# Patient Record
Sex: Male | Born: 1965 | Race: Black or African American | Hispanic: No | Marital: Single | Smoking: Never smoker
Health system: Southern US, Community
[De-identification: ages and names within clinical notes are randomized; demographics above are authoritative.]

## PROBLEM LIST (undated history)

## (undated) DIAGNOSIS — E119 Type 2 diabetes mellitus without complications: Secondary | ICD-10-CM

## (undated) DIAGNOSIS — D849 Immunodeficiency, unspecified: Secondary | ICD-10-CM

## (undated) DIAGNOSIS — G629 Polyneuropathy, unspecified: Secondary | ICD-10-CM

## (undated) DIAGNOSIS — I639 Cerebral infarction, unspecified: Secondary | ICD-10-CM

## (undated) DIAGNOSIS — R413 Other amnesia: Secondary | ICD-10-CM

## (undated) HISTORY — PX: FOOT SURGERY: SHX648

## (undated) HISTORY — PX: SALIVARY GLAND SURGERY: SHX768

## (undated) HISTORY — DX: Cerebral infarction, unspecified: I63.9

---

## 1998-01-21 ENCOUNTER — Emergency Department (HOSPITAL_COMMUNITY): Admission: EM | Admit: 1998-01-21 | Discharge: 1998-01-21 | Payer: Self-pay | Admitting: Emergency Medicine

## 1998-05-23 ENCOUNTER — Emergency Department (HOSPITAL_COMMUNITY): Admission: EM | Admit: 1998-05-23 | Discharge: 1998-05-23 | Payer: Self-pay | Admitting: Emergency Medicine

## 1998-06-14 ENCOUNTER — Emergency Department (HOSPITAL_COMMUNITY): Admission: EM | Admit: 1998-06-14 | Discharge: 1998-06-14 | Payer: Self-pay | Admitting: Emergency Medicine

## 1998-12-20 ENCOUNTER — Emergency Department (HOSPITAL_COMMUNITY): Admission: EM | Admit: 1998-12-20 | Discharge: 1998-12-20 | Payer: Self-pay | Admitting: Emergency Medicine

## 1999-05-16 ENCOUNTER — Encounter: Payer: Self-pay | Admitting: Family Medicine

## 1999-05-16 ENCOUNTER — Emergency Department (HOSPITAL_COMMUNITY): Admission: EM | Admit: 1999-05-16 | Discharge: 1999-05-16 | Payer: Self-pay | Admitting: Emergency Medicine

## 1999-07-20 ENCOUNTER — Ambulatory Visit (HOSPITAL_COMMUNITY): Admission: RE | Admit: 1999-07-20 | Discharge: 1999-07-20 | Payer: Self-pay | Admitting: Family Medicine

## 1999-07-20 ENCOUNTER — Encounter: Payer: Self-pay | Admitting: Family Medicine

## 1999-09-07 ENCOUNTER — Encounter (HOSPITAL_COMMUNITY): Admission: RE | Admit: 1999-09-07 | Discharge: 1999-12-06 | Payer: Self-pay | Admitting: Family Medicine

## 1999-09-14 ENCOUNTER — Encounter: Payer: Self-pay | Admitting: Family Medicine

## 1999-09-14 ENCOUNTER — Ambulatory Visit (HOSPITAL_COMMUNITY): Admission: RE | Admit: 1999-09-14 | Discharge: 1999-09-14 | Payer: Self-pay | Admitting: Family Medicine

## 2000-01-03 ENCOUNTER — Encounter (HOSPITAL_COMMUNITY): Admission: RE | Admit: 2000-01-03 | Discharge: 2000-04-02 | Payer: Self-pay | Admitting: Family Medicine

## 2000-04-03 ENCOUNTER — Encounter (HOSPITAL_COMMUNITY): Admission: RE | Admit: 2000-04-03 | Discharge: 2000-07-02 | Payer: Self-pay | Admitting: Family Medicine

## 2000-07-03 ENCOUNTER — Encounter (HOSPITAL_COMMUNITY): Admission: RE | Admit: 2000-07-03 | Discharge: 2000-10-01 | Payer: Self-pay | Admitting: Family Medicine

## 2002-01-01 ENCOUNTER — Encounter: Payer: Self-pay | Admitting: Emergency Medicine

## 2002-01-01 ENCOUNTER — Emergency Department (HOSPITAL_COMMUNITY): Admission: EM | Admit: 2002-01-01 | Discharge: 2002-01-01 | Payer: Self-pay | Admitting: Emergency Medicine

## 2002-01-08 ENCOUNTER — Emergency Department (HOSPITAL_COMMUNITY): Admission: EM | Admit: 2002-01-08 | Discharge: 2002-01-08 | Payer: Self-pay | Admitting: Emergency Medicine

## 2002-04-27 ENCOUNTER — Encounter: Admission: RE | Admit: 2002-04-27 | Discharge: 2002-04-27 | Payer: Self-pay | Admitting: Nephrology

## 2002-04-27 ENCOUNTER — Encounter: Payer: Self-pay | Admitting: Nephrology

## 2003-10-04 ENCOUNTER — Emergency Department (HOSPITAL_COMMUNITY): Admission: EM | Admit: 2003-10-04 | Discharge: 2003-10-04 | Payer: Self-pay | Admitting: Emergency Medicine

## 2004-04-13 ENCOUNTER — Ambulatory Visit: Payer: Self-pay | Admitting: Internal Medicine

## 2004-11-01 ENCOUNTER — Ambulatory Visit: Payer: Self-pay | Admitting: Internal Medicine

## 2004-11-20 ENCOUNTER — Ambulatory Visit: Payer: Self-pay | Admitting: Internal Medicine

## 2005-01-04 ENCOUNTER — Ambulatory Visit: Payer: Self-pay | Admitting: Internal Medicine

## 2005-10-23 ENCOUNTER — Ambulatory Visit: Payer: Self-pay | Admitting: Internal Medicine

## 2006-01-12 ENCOUNTER — Emergency Department (HOSPITAL_COMMUNITY): Admission: EM | Admit: 2006-01-12 | Discharge: 2006-01-12 | Payer: Self-pay | Admitting: Family Medicine

## 2006-05-23 ENCOUNTER — Ambulatory Visit: Payer: Self-pay | Admitting: Internal Medicine

## 2006-05-31 ENCOUNTER — Ambulatory Visit: Payer: Self-pay | Admitting: Internal Medicine

## 2006-06-17 ENCOUNTER — Ambulatory Visit: Payer: Self-pay | Admitting: Internal Medicine

## 2006-07-03 ENCOUNTER — Emergency Department (HOSPITAL_COMMUNITY): Admission: EM | Admit: 2006-07-03 | Discharge: 2006-07-03 | Payer: Self-pay | Admitting: Family Medicine

## 2006-07-04 ENCOUNTER — Ambulatory Visit: Payer: Self-pay | Admitting: Internal Medicine

## 2006-07-25 ENCOUNTER — Emergency Department (HOSPITAL_COMMUNITY): Admission: EM | Admit: 2006-07-25 | Discharge: 2006-07-25 | Payer: Self-pay | Admitting: Family Medicine

## 2006-08-22 ENCOUNTER — Ambulatory Visit: Payer: Self-pay | Admitting: Internal Medicine

## 2007-01-30 DIAGNOSIS — E785 Hyperlipidemia, unspecified: Secondary | ICD-10-CM | POA: Insufficient documentation

## 2007-01-30 DIAGNOSIS — E119 Type 2 diabetes mellitus without complications: Secondary | ICD-10-CM

## 2007-01-30 DIAGNOSIS — B20 Human immunodeficiency virus [HIV] disease: Secondary | ICD-10-CM

## 2007-01-30 DIAGNOSIS — I10 Essential (primary) hypertension: Secondary | ICD-10-CM

## 2007-02-06 ENCOUNTER — Emergency Department (HOSPITAL_COMMUNITY): Admission: EM | Admit: 2007-02-06 | Discharge: 2007-02-06 | Payer: Self-pay | Admitting: Emergency Medicine

## 2007-02-11 ENCOUNTER — Ambulatory Visit: Payer: Self-pay | Admitting: Internal Medicine

## 2007-04-20 ENCOUNTER — Emergency Department (HOSPITAL_COMMUNITY): Admission: EM | Admit: 2007-04-20 | Discharge: 2007-04-20 | Payer: Self-pay | Admitting: Emergency Medicine

## 2007-04-22 ENCOUNTER — Encounter: Admission: RE | Admit: 2007-04-22 | Discharge: 2007-04-22 | Payer: Self-pay | Admitting: Sports Medicine

## 2007-06-19 ENCOUNTER — Ambulatory Visit: Payer: Self-pay | Admitting: Internal Medicine

## 2007-06-19 LAB — CONVERTED CEMR LAB
ALT: 22 units/L (ref 0–53)
AST: 20 units/L (ref 0–37)
Albumin: 4.3 g/dL (ref 3.5–5.2)
Alkaline Phosphatase: 59 units/L (ref 39–117)
Basophils Absolute: 0 10*3/uL (ref 0.0–0.1)
Basophils Relative: 0 % (ref 0–1)
Calcium: 9.4 mg/dL (ref 8.4–10.5)
Chlamydia, Swab/Urine, PCR: NEGATIVE
Chloride: 98 meq/L (ref 96–112)
HIV 1 RNA Quant: 6830 copies/mL — ABNORMAL HIGH (ref <50)
LDL Cholesterol: 178 mg/dL — ABNORMAL HIGH (ref 0–99)
MCHC: 32.7 g/dL (ref 30.0–36.0)
Monocytes Relative: 9 % (ref 3–12)
Neutro Abs: 0.9 10*3/uL — ABNORMAL LOW (ref 1.7–7.7)
Neutrophils Relative %: 24 % — ABNORMAL LOW (ref 43–77)
Platelets: 208 10*3/uL (ref 150–400)
Potassium: 4.4 meq/L (ref 3.5–5.3)
RBC: 5.11 M/uL (ref 4.22–5.81)
RDW: 14.4 % (ref 11.5–15.5)
Sodium: 134 meq/L — ABNORMAL LOW (ref 135–145)
Total Protein: 7.9 g/dL (ref 6.0–8.3)

## 2007-07-16 ENCOUNTER — Ambulatory Visit: Payer: Self-pay | Admitting: Internal Medicine

## 2007-08-28 ENCOUNTER — Ambulatory Visit: Payer: Self-pay | Admitting: Internal Medicine

## 2007-10-09 ENCOUNTER — Encounter: Payer: Self-pay | Admitting: Internal Medicine

## 2007-10-09 ENCOUNTER — Ambulatory Visit: Payer: Self-pay | Admitting: Internal Medicine

## 2007-10-09 LAB — CONVERTED CEMR LAB
AST: 17 units/L (ref 0–37)
Albumin: 4.1 g/dL (ref 3.5–5.2)
Alkaline Phosphatase: 79 units/L (ref 39–117)
BUN: 10 mg/dL (ref 6–23)
Basophils Relative: 0 % (ref 0–1)
Calcium: 9 mg/dL (ref 8.4–10.5)
Chloride: 95 meq/L — ABNORMAL LOW (ref 96–112)
Eosinophils Absolute: 0 10*3/uL (ref 0.0–0.7)
HIV-1 RNA Quant, Log: 3.9 — ABNORMAL HIGH (ref <1.70)
Lymphs Abs: 2.2 10*3/uL (ref 0.7–4.0)
MCHC: 35.8 g/dL (ref 30.0–36.0)
Monocytes Relative: 7 % (ref 3–12)
Neutro Abs: 2.2 10*3/uL (ref 1.7–7.7)
Neutrophils Relative %: 47 % (ref 43–77)
Platelets: 170 10*3/uL (ref 150–400)
Potassium: 4.5 meq/L (ref 3.5–5.3)
RBC: 4.99 M/uL (ref 4.22–5.81)
Sodium: 132 meq/L — ABNORMAL LOW (ref 135–145)
Total Lymphocytes %: 46 % (ref 12–46)
Total Protein: 8.4 g/dL — ABNORMAL HIGH (ref 6.0–8.3)
WBC, lymph enumeration: 4.7 10*3/uL (ref 4.0–10.5)
WBC: 4.7 10*3/uL (ref 4.0–10.5)

## 2007-10-23 ENCOUNTER — Ambulatory Visit: Payer: Self-pay | Admitting: Internal Medicine

## 2007-11-14 ENCOUNTER — Encounter: Admission: RE | Admit: 2007-11-14 | Discharge: 2007-11-14 | Payer: Self-pay | Admitting: Neurosurgery

## 2008-02-27 ENCOUNTER — Emergency Department (HOSPITAL_COMMUNITY): Admission: EM | Admit: 2008-02-27 | Discharge: 2008-02-27 | Payer: Self-pay | Admitting: Emergency Medicine

## 2008-03-12 ENCOUNTER — Emergency Department (HOSPITAL_COMMUNITY): Admission: EM | Admit: 2008-03-12 | Discharge: 2008-03-12 | Payer: Self-pay | Admitting: Emergency Medicine

## 2008-06-21 ENCOUNTER — Ambulatory Visit: Payer: Self-pay | Admitting: Internal Medicine

## 2008-06-21 LAB — CONVERTED CEMR LAB
ALT: 21 units/L (ref 0–53)
Absolute CD4: 234 #/uL — ABNORMAL LOW (ref 381–1469)
Alkaline Phosphatase: 62 units/L (ref 39–117)
Basophils Absolute: 0 10*3/uL (ref 0.0–0.1)
CD4 T Helper %: 15 % — ABNORMAL LOW (ref 32–62)
Eosinophils Absolute: 0 10*3/uL (ref 0.0–0.7)
Eosinophils Relative: 0 % (ref 0–5)
HCT: 40.6 % (ref 39.0–52.0)
HIV-1 RNA Quant, Log: 5.17 — ABNORMAL HIGH (ref <1.68)
MCHC: 33.7 g/dL (ref 30.0–36.0)
MCV: 86 fL (ref 78.0–100.0)
Microalb, Ur: 3.8 mg/dL — ABNORMAL HIGH (ref 0.00–1.89)
Platelets: 242 10*3/uL (ref 150–400)
RDW: 13.3 % (ref 11.5–15.5)
Sodium: 134 meq/L — ABNORMAL LOW (ref 135–145)
Total Bilirubin: 0.4 mg/dL (ref 0.3–1.2)
Total Lymphocytes %: 52 % — ABNORMAL HIGH (ref 12–46)
Total Protein: 8.1 g/dL (ref 6.0–8.3)
WBC, lymph enumeration: 3 10*3/uL — ABNORMAL LOW (ref 4.0–10.5)

## 2008-06-30 ENCOUNTER — Encounter: Admission: RE | Admit: 2008-06-30 | Discharge: 2008-07-14 | Payer: Self-pay | Admitting: Internal Medicine

## 2008-07-19 ENCOUNTER — Emergency Department (HOSPITAL_COMMUNITY): Admission: EM | Admit: 2008-07-19 | Discharge: 2008-07-19 | Payer: Self-pay | Admitting: Emergency Medicine

## 2008-07-26 ENCOUNTER — Ambulatory Visit: Payer: Self-pay | Admitting: Internal Medicine

## 2008-08-12 ENCOUNTER — Ambulatory Visit: Payer: Self-pay | Admitting: Internal Medicine

## 2008-08-12 LAB — CONVERTED CEMR LAB
AST: 18 units/L (ref 0–37)
Alkaline Phosphatase: 61 units/L (ref 39–117)
BUN: 12 mg/dL (ref 6–23)
Basophils Relative: 1 % (ref 0–1)
CD4 T Helper %: 19 % — ABNORMAL LOW (ref 32–62)
Creatinine, Ser: 0.92 mg/dL (ref 0.40–1.50)
Eosinophils Absolute: 0 10*3/uL (ref 0.0–0.7)
Eosinophils Relative: 1 % (ref 0–5)
HCT: 47.6 % (ref 39.0–52.0)
HIV 1 RNA Quant: 2890 copies/mL — ABNORMAL HIGH (ref <48)
HIV-1 RNA Quant, Log: 3.46 — ABNORMAL HIGH (ref <1.68)
MCHC: 32.8 g/dL (ref 30.0–36.0)
MCV: 88.3 fL (ref 78.0–100.0)
Monocytes Absolute: 0.3 10*3/uL (ref 0.1–1.0)
Monocytes Relative: 9 % (ref 3–12)
Neutrophils Relative %: 27 % — ABNORMAL LOW (ref 43–77)
Potassium: 4.3 meq/L (ref 3.5–5.3)
RBC: 5.39 M/uL (ref 4.22–5.81)
Total Bilirubin: 1 mg/dL (ref 0.3–1.2)
Total Lymphocytes %: 63 % — ABNORMAL HIGH (ref 12–46)
Total lymphocyte count: 2205 cells/mcL (ref 700–3300)
WBC, lymph enumeration: 3.5 10*3/uL — ABNORMAL LOW (ref 4.0–10.5)

## 2008-08-26 ENCOUNTER — Ambulatory Visit: Payer: Self-pay | Admitting: Internal Medicine

## 2008-08-26 ENCOUNTER — Encounter: Payer: Self-pay | Admitting: Internal Medicine

## 2008-08-26 DIAGNOSIS — M5126 Other intervertebral disc displacement, lumbar region: Secondary | ICD-10-CM | POA: Insufficient documentation

## 2008-08-26 DIAGNOSIS — F528 Other sexual dysfunction not due to a substance or known physiological condition: Secondary | ICD-10-CM

## 2008-09-24 ENCOUNTER — Telehealth: Payer: Self-pay | Admitting: Internal Medicine

## 2009-01-31 ENCOUNTER — Emergency Department (HOSPITAL_COMMUNITY): Admission: EM | Admit: 2009-01-31 | Discharge: 2009-01-31 | Payer: Self-pay | Admitting: Emergency Medicine

## 2009-04-07 ENCOUNTER — Ambulatory Visit: Payer: Self-pay | Admitting: Internal Medicine

## 2009-04-07 ENCOUNTER — Encounter: Payer: Self-pay | Admitting: Internal Medicine

## 2009-04-07 DIAGNOSIS — J019 Acute sinusitis, unspecified: Secondary | ICD-10-CM

## 2009-04-07 LAB — CONVERTED CEMR LAB
Albumin: 3.8 g/dL (ref 3.5–5.2)
Alkaline Phosphatase: 68 units/L (ref 39–117)
BUN: 13 mg/dL (ref 6–23)
Basophils Absolute: 0 10*3/uL (ref 0.0–0.1)
Basophils Relative: 0 % (ref 0–1)
CD4 T Helper %: 27 % — ABNORMAL LOW (ref 32–62)
CO2: 19 meq/L (ref 19–32)
Cholesterol: 215 mg/dL — ABNORMAL HIGH (ref 0–200)
Eosinophils Relative: 1 % (ref 0–5)
Glucose, Bld: 278 mg/dL — ABNORMAL HIGH (ref 70–99)
HCT: 39.6 % (ref 39.0–52.0)
HDL: 54 mg/dL (ref >39)
Hemoglobin: 13 g/dL (ref 13.0–17.0)
LDL Cholesterol: 129 mg/dL — ABNORMAL HIGH (ref 0–99)
MCHC: 32.8 g/dL (ref 30.0–36.0)
Monocytes Absolute: 0.4 10*3/uL (ref 0.1–1.0)
Monocytes Relative: 9 % (ref 3–12)
Potassium: 3.9 meq/L (ref 3.5–5.3)
RBC: 4.52 M/uL (ref 4.22–5.81)
RDW: 13 % (ref 11.5–15.5)
Sodium: 137 meq/L (ref 135–145)
Total Bilirubin: 1 mg/dL (ref 0.3–1.2)
Total Protein: 7 g/dL (ref 6.0–8.3)
Total lymphocyte count: 1720 cells/mcL (ref 700–3300)
Triglycerides: 158 mg/dL — ABNORMAL HIGH (ref <150)

## 2009-04-20 ENCOUNTER — Telehealth: Payer: Self-pay | Admitting: Internal Medicine

## 2009-04-29 ENCOUNTER — Emergency Department (HOSPITAL_COMMUNITY): Admission: EM | Admit: 2009-04-29 | Discharge: 2009-04-29 | Payer: Self-pay | Admitting: Emergency Medicine

## 2009-05-02 ENCOUNTER — Telehealth: Payer: Self-pay | Admitting: Internal Medicine

## 2009-07-28 ENCOUNTER — Telehealth: Payer: Self-pay | Admitting: Internal Medicine

## 2009-08-20 ENCOUNTER — Emergency Department (HOSPITAL_COMMUNITY): Admission: EM | Admit: 2009-08-20 | Discharge: 2009-08-20 | Payer: Self-pay | Admitting: Emergency Medicine

## 2009-08-25 ENCOUNTER — Ambulatory Visit: Payer: Self-pay | Admitting: Internal Medicine

## 2009-08-25 ENCOUNTER — Encounter: Payer: Self-pay | Admitting: Internal Medicine

## 2009-08-25 DIAGNOSIS — J209 Acute bronchitis, unspecified: Secondary | ICD-10-CM | POA: Insufficient documentation

## 2009-08-25 LAB — CONVERTED CEMR LAB: Blood Glucose, Fingerstick: 232

## 2009-08-26 ENCOUNTER — Encounter: Payer: Self-pay | Admitting: Internal Medicine

## 2009-08-26 LAB — CONVERTED CEMR LAB
ALT: 13 units/L (ref 0–53)
AST: 18 units/L (ref 0–37)
CD4 T Helper %: 25 % — ABNORMAL LOW (ref 32–62)
CO2: 28 meq/L (ref 19–32)
Calcium: 9.4 mg/dL (ref 8.4–10.5)
Chloride: 99 meq/L (ref 96–112)
Creatinine, Ser: 0.95 mg/dL (ref 0.40–1.50)
Eosinophils Absolute: 0 10*3/uL (ref 0.0–0.7)
HIV 1 RNA Quant: 48 copies/mL — ABNORMAL HIGH (ref <48)
HIV-1 RNA Quant, Log: 1.68 — ABNORMAL HIGH (ref <1.68)
Lymphocytes Relative: 39 % (ref 12–46)
Lymphs Abs: 2.3 10*3/uL (ref 0.7–4.0)
Monocytes Relative: 8 % (ref 3–12)
Neutro Abs: 3 10*3/uL (ref 1.7–7.7)
Neutrophils Relative %: 53 % (ref 43–77)
Platelets: 235 10*3/uL (ref 150–400)
Potassium: 4.8 meq/L (ref 3.5–5.3)
RBC: 5.31 M/uL (ref 4.22–5.81)
Sodium: 139 meq/L (ref 135–145)
Total CHOL/HDL Ratio: 4.6
Total Protein: 8.3 g/dL (ref 6.0–8.3)
WBC: 5.7 10*3/uL (ref 4.0–10.5)

## 2009-09-05 ENCOUNTER — Telehealth: Payer: Self-pay | Admitting: Internal Medicine

## 2009-11-21 ENCOUNTER — Emergency Department (HOSPITAL_COMMUNITY): Admission: EM | Admit: 2009-11-21 | Discharge: 2009-11-21 | Payer: Self-pay | Admitting: Emergency Medicine

## 2010-01-13 ENCOUNTER — Emergency Department (HOSPITAL_COMMUNITY): Admission: EM | Admit: 2010-01-13 | Discharge: 2010-01-13 | Payer: Self-pay | Admitting: Emergency Medicine

## 2010-03-29 ENCOUNTER — Encounter: Payer: Self-pay | Admitting: Internal Medicine

## 2010-03-29 ENCOUNTER — Ambulatory Visit: Payer: Self-pay | Admitting: Internal Medicine

## 2010-03-29 DIAGNOSIS — IMO0002 Reserved for concepts with insufficient information to code with codable children: Secondary | ICD-10-CM

## 2010-03-29 LAB — CONVERTED CEMR LAB
ALT: 20 units/L (ref 0–53)
AST: 20 units/L (ref 0–37)
Basophils Absolute: 0 10*3/uL (ref 0.0–0.1)
Basophils Relative: 0 % (ref 0–1)
Creatinine, Ser: 1.01 mg/dL (ref 0.40–1.50)
Eosinophils Relative: 1 % (ref 0–5)
HCT: 46.4 % (ref 39.0–52.0)
Hemoglobin: 15.3 g/dL (ref 13.0–17.0)
MCHC: 33 g/dL (ref 30.0–36.0)
Monocytes Absolute: 0.4 10*3/uL (ref 0.1–1.0)
Neutro Abs: 0.8 10*3/uL — ABNORMAL LOW (ref 1.7–7.7)
Platelets: 192 10*3/uL (ref 150–400)
RDW: 12.7 % (ref 11.5–15.5)
Total Bilirubin: 1.9 mg/dL — ABNORMAL HIGH (ref 0.3–1.2)

## 2010-04-03 ENCOUNTER — Encounter (INDEPENDENT_AMBULATORY_CARE_PROVIDER_SITE_OTHER): Payer: Self-pay | Admitting: *Deleted

## 2010-04-04 ENCOUNTER — Encounter (INDEPENDENT_AMBULATORY_CARE_PROVIDER_SITE_OTHER): Payer: Self-pay | Admitting: *Deleted

## 2010-04-04 ENCOUNTER — Encounter (HOSPITAL_BASED_OUTPATIENT_CLINIC_OR_DEPARTMENT_OTHER): Admission: RE | Admit: 2010-04-04 | Discharge: 2010-05-10 | Payer: Self-pay | Admitting: General Surgery

## 2010-04-05 ENCOUNTER — Ambulatory Visit (HOSPITAL_COMMUNITY): Admission: RE | Admit: 2010-04-05 | Discharge: 2010-04-05 | Payer: Self-pay | Admitting: General Surgery

## 2010-04-06 ENCOUNTER — Encounter (INDEPENDENT_AMBULATORY_CARE_PROVIDER_SITE_OTHER): Payer: Self-pay | Admitting: *Deleted

## 2010-04-14 ENCOUNTER — Ambulatory Visit: Payer: Self-pay | Admitting: Internal Medicine

## 2010-04-14 ENCOUNTER — Encounter (INDEPENDENT_AMBULATORY_CARE_PROVIDER_SITE_OTHER): Payer: Self-pay | Admitting: *Deleted

## 2010-04-14 LAB — CONVERTED CEMR LAB

## 2010-04-17 ENCOUNTER — Encounter: Payer: Self-pay | Admitting: Internal Medicine

## 2010-04-19 ENCOUNTER — Encounter: Payer: Self-pay | Admitting: Internal Medicine

## 2010-04-28 ENCOUNTER — Ambulatory Visit: Payer: Self-pay | Admitting: Vascular Surgery

## 2010-05-08 ENCOUNTER — Ambulatory Visit: Payer: Self-pay | Admitting: Internal Medicine

## 2010-05-08 DIAGNOSIS — M79609 Pain in unspecified limb: Secondary | ICD-10-CM | POA: Insufficient documentation

## 2010-05-08 DIAGNOSIS — R21 Rash and other nonspecific skin eruption: Secondary | ICD-10-CM | POA: Insufficient documentation

## 2010-05-08 LAB — CONVERTED CEMR LAB: Hgb A1c MFr Bld: 12.7 %

## 2010-05-19 ENCOUNTER — Encounter: Payer: Self-pay | Admitting: Internal Medicine

## 2010-05-19 ENCOUNTER — Encounter (INDEPENDENT_AMBULATORY_CARE_PROVIDER_SITE_OTHER): Payer: Self-pay | Admitting: *Deleted

## 2010-06-07 ENCOUNTER — Encounter (INDEPENDENT_AMBULATORY_CARE_PROVIDER_SITE_OTHER): Payer: Self-pay | Admitting: *Deleted

## 2010-06-13 ENCOUNTER — Encounter (INDEPENDENT_AMBULATORY_CARE_PROVIDER_SITE_OTHER): Payer: Self-pay | Admitting: *Deleted

## 2010-07-05 ENCOUNTER — Emergency Department (HOSPITAL_COMMUNITY)
Admission: EM | Admit: 2010-07-05 | Discharge: 2010-07-05 | Payer: Self-pay | Source: Home / Self Care | Admitting: Emergency Medicine

## 2010-07-06 ENCOUNTER — Emergency Department (HOSPITAL_COMMUNITY)
Admission: EM | Admit: 2010-07-06 | Discharge: 2010-07-06 | Payer: Self-pay | Source: Home / Self Care | Admitting: Family Medicine

## 2010-08-06 ENCOUNTER — Encounter: Payer: Self-pay | Admitting: Sports Medicine

## 2010-08-07 ENCOUNTER — Encounter: Payer: Self-pay | Admitting: Internal Medicine

## 2010-08-07 ENCOUNTER — Ambulatory Visit
Admission: RE | Admit: 2010-08-07 | Discharge: 2010-08-07 | Payer: Self-pay | Source: Home / Self Care | Attending: Internal Medicine | Admitting: Internal Medicine

## 2010-08-07 LAB — CONVERTED CEMR LAB
ALT: 19 units/L (ref 0–53)
AST: 22 units/L (ref 0–37)
Albumin: 3.9 g/dL (ref 3.5–5.2)
Basophils Absolute: 0 10*3/uL (ref 0.0–0.1)
Calcium: 8.9 mg/dL (ref 8.4–10.5)
Chloride: 103 meq/L (ref 96–112)
Creatinine, Ser: 0.92 mg/dL (ref 0.40–1.50)
Eosinophils Relative: 1 % (ref 0–5)
HCT: 39.1 % (ref 39.0–52.0)
HIV 1 RNA Quant: 33 copies/mL — ABNORMAL HIGH (ref <20)
Lymphocytes Relative: 59 % — ABNORMAL HIGH (ref 12–46)
Platelets: 229 10*3/uL (ref 150–400)
Potassium: 4.6 meq/L (ref 3.5–5.3)
RDW: 13 % (ref 11.5–15.5)
Sodium: 139 meq/L (ref 135–145)

## 2010-08-09 LAB — T-HELPER CELL (CD4) - (RCID CLINIC ONLY)
CD4 % Helper T Cell: 20 % — ABNORMAL LOW (ref 33–55)
CD4 T Cell Abs: 480 uL (ref 400–2700)

## 2010-08-15 NOTE — Miscellaneous (Signed)
Summary: Office Visit ([facility])    Vital Signs:  Patient profile:   45 year old male Weight:      207 pounds Temp:     98.3 degrees F oral Pulse rate:   83 / minute Pulse rhythm:   regular Resp:     20 per minute BP sitting:   135 / 77  (left arm)  Vitals Entered By: Sharen Heck RN (August 25, 2009 3:06 PM) CC: f/u 05, cough productive of gray phlegm, reports yellow nasal drainage Pain Assessment Patient in pain? yes     Location: lower back Intensity: 7 Type: aching CBG Result 232  Does patient need assistance? Functional Status Self care Ambulation Normal   CC:  f/u 05, cough productive of gray phlegm, and reports yellow nasal drainage.  History of Present Illness: Pt c/o cough for about 3 weeks.  He has tried OTC meds without relief.  He has had sweats - did not take his temperature.  Preventive Screening-Counseling & Management  Alcohol-Tobacco     Alcohol type: occ     Smoking Status: never  Caffeine-Diet-Exercise     Caffeine use/day: 1     Does Patient Exercise: no  Current Problems (verified): 1)  Acute Bronchitis  (ICD-466.0) 2)  Acute Sinusitis, Unspecified  (ICD-461.9) 3)  Erectile Dysfunction  (ICD-302.72) 4)  Herniated Lumbar Disc  (ICD-722.10) 5)  Hypertension  (ICD-401.9) 6)  Hyperlipidemia  (ICD-272.4) 7)  HIV Disease  (ICD-042) 8)  Diabetes Mellitus, Type II  (ICD-250.00)  Current Medications (verified): 1)  Norvir 100 Mg Caps (Ritonavir) .... Take 1 Capsule By Mouth Once A Day 2)  Reyataz 300 Mg Caps (Atazanavir Sulfate) .... Take 1 Capsule By Mouth Once A Day 3)  Vasotec 5 Mg  Tabs (Enalapril Maleate) .... Take 1 Tablet By Mouth Once A Day 4)  Glucophage 1000 Mg  Tabs (Metformin Hcl) .... Take 1 Tablet By Mouth Two Times A Day 5)  Glucotrol Xl 10 Mg  Tb24 (Glipizide) .... Take 1 Tablet By Mouth Once A Day 6)  Truvada 200-300 Mg Tabs (Emtricitabine-Tenofovir) .... Take 1 Tablet By Mouth Once A Day 7)  Vicodin 5-500 Mg Tabs  (Hydrocodone-Acetaminophen) .... Take 1 Tablet By Mouth Every 8 Hours As Needed 8)  Amaryl 2 Mg Tabs (Glimepiride) .... Take 1 Tablet By Mouth Once A Day 9)  Pravastatin Sodium 40 Mg Tabs (Pravastatin Sodium) .... Take 1 Tablet By Mouth Once A Day 10)  Zithromax Z-Pak 250 Mg Tabs (Azithromycin) .... Take As Directed  Allergies: 1)  ! Sulfa 2)  ! * Dapsone   Review of Systems  The patient denies anorexia, dyspnea on exertion, and hemoptysis.     Physical Exam  General:  alert, well-developed, well-nourished, and well-hydrated.   Head:  normocephalic and atraumatic.   Mouth:  pharynx pink and moist.   Lungs:  normal breath sounds.      Impression & Recommendations:  Problem # 1:  HIV DISEASE (ICD-042) Will obtain labs today and have pt f/u in 2 weeks. Diagnostics Reviewed:  WBC: 4.3 (04/07/2009)   Hgb: 13.0 (04/07/2009)   HCT: 39.6 (04/07/2009)   Platelets: 208 (04/07/2009) HIV genotype: REPORT (10/09/2007)   HIV-1 RNA: 89 (04/07/2009)     Problem # 2:  ACUTE BRONCHITIS (ICD-466.0) will treat with a z-pack. His updated medication list for this problem includes:    Zithromax Z-pak 250 Mg Tabs (Azithromycin) .Marland Kitchen... Take as directed  Problem # 3:  HYPERLIPIDEMIA (ICD-272.4) check lipid panel.  The following medications were removed from the medication list:    Zetia 10 Mg Tabs (Ezetimibe) .Marland Kitchen... Take 1 tablet by mouth once a day His updated medication list for this problem includes:    Pravastatin Sodium 40 Mg Tabs (Pravastatin sodium) .Marland Kitchen... Take 1 tablet by mouth once a day  Problem # 4:  DIABETES MELLITUS, TYPE II (ICD-250.00) Pt encouraged to get cough medication for diabetics His updated medication list for this problem includes:    Vasotec 5 Mg Tabs (Enalapril maleate) .Marland Kitchen... Take 1 tablet by mouth once a day    Glucophage 1000 Mg Tabs (Metformin hcl) .Marland Kitchen... Take 1 tablet by mouth two times a day    Glucotrol Xl 10 Mg Tb24 (Glipizide) .Marland Kitchen... Take 1 tablet by mouth once a  day    Amaryl 2 Mg Tabs (Glimepiride) .Marland Kitchen... Take 1 tablet by mouth once a day  Medications Added to Medication List This Visit: 1)  Pravastatin Sodium 40 Mg Tabs (Pravastatin sodium) .... Take 1 tablet by mouth once a day 2)  Zithromax Z-pak 250 Mg Tabs (Azithromycin) .... Take as directed  Other Orders: T-CD4SP [facility]) (CD4SP) T-HIV Viral Load [phone]) T-Comprehensive Metabolic Panel [phone]) T-Lipid Profile [id number]) T-CBC w/Diff [phone]) T-RPR (Syphilis) ([id number]) Est. Patient Level III (06237)    Patient Instructions: 1)  Please schedule a follow-up appointment in 2 weeks. Prescriptions: VICODIN 5-500 MG TABS (HYDROCODONE-ACETAMINOPHEN) Take 1 tablet by mouth every 8 hours as needed  #30 x 0   Entered and Authorized by:   Yisroel Ramming MD   Signed by:   Yisroel Ramming MD on 08/25/2009   Method used:   Print then Give to Patient   RxID:   [id number] ZITHROMAX Z-PAK 250 MG TABS (AZITHROMYCIN) take as directed  #1 pack x 0   Entered and Authorized by:   Yisroel Ramming MD   Signed by:   Yisroel Ramming MD on 08/25/2009   Method used:   Print then Give to Patient   RxID:   [id number]

## 2010-08-15 NOTE — Miscellaneous (Signed)
Summary: Bridge Counselor  Clinical Lists Changes  Bridge Counselor Lilyan Punt will be unable to transport [facility] Wed, 09/21 @ 12:30 due to another scheduled commitment.  Client will transport himself to scheduled appt.

## 2010-08-15 NOTE — Assessment & Plan Note (Signed)
Summary: 2WK F/U/VS   CC:  follow-up visit, on right toe infection, c/o sinus congestion, and also lab results.  History of Present Illness: Pt here for f/u.  toe is healing well. He missed a week of his HIV meds but has otherwise been taking them. H received his new glucometer in the mail and will start monitoring his BS.  He also plans to start exercising again.  Preventive Screening-Counseling & Management  Alcohol-Tobacco     Alcohol type: occ     Smoking Status: never  Caffeine-Diet-Exercise     Caffeine use/day: 1     Does Patient Exercise: no  Hep-HIV-STD-Contraception     HIV Risk: no  Safety-Violence-Falls     Seat Belt Use: 75      Drug Use:  no.    Comments: pt. declined condoms   Updated Prior Medication List: NORVIR 100 MG CAPS (RITONAVIR) Take 1 capsule by mouth once a day REYATAZ 300 MG CAPS (ATAZANAVIR SULFATE) Take 1 capsule by mouth once a day VASOTEC 5 MG  TABS (ENALAPRIL MALEATE) Take 1 tablet by mouth once a day GLUCOPHAGE 1000 MG  TABS (METFORMIN HCL) Take 1 tablet by mouth two times a day GLUCOTROL XL 10 MG  TB24 (GLIPIZIDE) Take 1 tablet by mouth once a day TRUVADA 200-300 MG TABS (EMTRICITABINE-TENOFOVIR) Take 1 tablet by mouth once a day VICODIN 5-500 MG TABS (HYDROCODONE-ACETAMINOPHEN) Take 1 tablet by mouth every 8 hours as needed AMARYL 2 MG TABS (GLIMEPIRIDE) Take 1 tablet by mouth once a day PRAVASTATIN SODIUM 40 MG TABS (PRAVASTATIN SODIUM) Take 1 tablet by mouth once a day VIAGRA 25 MG TABS (SILDENAFIL CITRATE) take as directed  Current Allergies (reviewed today): ! SULFA ! * DAPSONE Past History:  Past Medical History: Last updated: 01/30/2007 Diabetes mellitus, type II HIV disease Hyperlipidemia Hypertension  Review of Systems  The patient denies anorexia, fever, weight loss, and chest pain.    Vital Signs:  Patient profile:   45 year old male Height:      70 inches (177.80 cm) Weight:      198.4 pounds (90.18 kg) BMI:      28.57 Temp:     98.0 degrees F (36.67 degrees C) oral Pulse rate:   94 / minute BP sitting:   134 / 93  (right arm)  Vitals Entered By: Wendall Mola CMA Duncan Dull) (April 14, 2010 10:40 AM) CC: follow-up visit,  on right toe infection, c/o sinus congestion, also lab results Is Patient Diabetic? Yes Did you bring your meter with you today? No Pain Assessment Patient in pain? no      Nutritional Status BMI of 25 - 29 = overweight Nutritional Status Detail appetite "good"  Does patient need assistance? Functional Status Self care Ambulation Normal Comments no missed doses of meds per pt.   Physical Exam  General:  alert, well-developed, well-nourished, and well-hydrated.   Head:  normocephalic and atraumatic.   Mouth:  pharynx pink and moist.  no thrush  Lungs:  normal breath sounds.   Extremities:  left 4 th toe improved   Impression & Recommendations:  Problem # 1:  HIV DISEASE (ICD-042) VL up will check a genotype.  Pt to continue current meds and try not to miss any doses. F/u in 3 weeks. Orders: T-HIV Genotype ([id number]) Est. Patient Level IV (14782)  Diagnostics Reviewed:  CD4: 360 (03/30/2010)   WBC: 3.3 (03/29/2010)   Hgb: 15.3 (03/29/2010)   HCT: 46.4 (03/29/2010)   Platelets: 192 (  03/29/2010) HIV genotype: REPORT (10/09/2007)   HIV-1 RNA: 77500 (03/29/2010)     Problem # 2:  CELLULITIS AND ABSCESS OF UNSPECIFIED DIGIT (ICD-681.9) Assessment: Improved  Problem # 3:  DIABETES MELLITUS, TYPE II (ICD-250.00)  Pt is going to work on [employer], [employer]. His updated medication list for this problem includes:    Vasotec 5 Mg Tabs (Enalapril maleate) .Marland Kitchen... Take 1 tablet by mouth once a day    Glucophage 1000 Mg Tabs (Metformin hcl) .Marland Kitchen... Take 1 tablet by mouth two times a day    Glucotrol Xl 10 Mg Tb24 (Glipizide) .Marland Kitchen... Take 1 tablet by mouth once a day    Amaryl 2 Mg Tabs (Glimepiride) .Marland Kitchen... Take 1 tablet by mouth once a day  Orders: Est. Patient  Level IV (16109)  Labs Reviewed: Creat: 1.01 (03/29/2010)    Reviewed HgBA1c results: 12.6 (03/29/2010)  >14 (04/07/2009)  Medications Added to Medication List This Visit: 1)  Viagra 25 Mg Tabs (Sildenafil citrate) .... Take as directed  Other Orders: Influenza Vaccine NON MCR (60454)  Patient Instructions: 1)  Please schedule a follow-up appointment in 3 weeks. Prescriptions: VIAGRA 25 MG TABS (SILDENAFIL CITRATE) take as directed  #10 x 3   Entered and Authorized by:   Yisroel Ramming MD   Signed by:   Yisroel Ramming MD on 04/14/2010   Method used:   Print then Give to Patient   RxID:   [id number]    Immunizations Administered:  Influenza Vaccine # 1:    Vaccine Type: Fluvax Non-MCR    Site: right deltoid    Mfr: Novartis    Dose: 0.5 ml    Route: IM    Given by: Wendall Mola CMA ( AAMA)    Exp. Date: 10/15/2010    Lot #: [id number]    VIS given: 02/07/10 version given April 14, 2010.  Flu Vaccine Consent Questions:    Do you have a history of severe allergic reactions to this vaccine? no    Any prior history of allergic reactions to egg and/or gelatin? no    Do you have a sensitivity to the preservative Thimersol? no    Do you have a past history of Guillan-Barre Syndrome? no    Do you currently have an acute febrile illness? no    Have you ever had a severe reaction to latex? no    Vaccine information given and explained to patient? yes

## 2010-08-15 NOTE — Miscellaneous (Signed)
Summary: Bridge Counseling  Clinical Lists Changes  After client rescheduling tx adherence appts a couple of times, BC finally met with client 06/12/10 to watch client fill his pill box.  Client filled pill box correctly and BC noticed that client has been taking his meds (client also assured BC that he is taking his meds).  Client states that his labs should be excellent the next lab appt.    06/13/10-BC closed client's bridge counseling file.  Client has met all of his bridge counseling goals.

## 2010-08-15 NOTE — Progress Notes (Signed)
Summary: med refill  Phone Note Refill Request Message from:  Fax from Pharmacy on July 28, 2009 9:14 AM  Pravastatin 40 mg   Method Requested: Telephone to Pharmacy Next Appointment Scheduled: last seen 9/23 10 Initial call taken by: Sharen Heck RN,  July 28, 2009 9:23 AM  Follow-up for Phone Call        ok x 1 needs appt. Follow-up by: Yisroel Ramming MD,  July 28, 2009 9:54 AM     Appended Document: med refill Zetia was changed by Dr. Reche Dixon in November to Pravastatin 40mg  as Zetia was not covered by Medicaid.   Clinical Lists Changes  Medications: Added new medication of * PRAVASTATIN 40 MG Take one tablet daily. - Signed Rx of PRAVASTATIN 40 MG Take one tablet daily.;  #30 x 0;  Signed;  Entered by: Sharen Heck RN;  Authorized by: Yisroel Ramming MD;  Method used: Telephoned to [facility], [address], [place], [place]  [address], Ph: [phone], Fax: [phone]    Prescriptions: PRAVASTATIN 40 MG Take one tablet daily.  #30 x 0   Entered by:   Sharen Heck RN   Authorized by:   Yisroel Ramming MD   Signed by:   Yisroel Ramming MD on 07/29/2009   Method used:   Telephoned to ...       [facility] [employer] (mail-order)       [address] [place], [place]  [address]       Ph: [phone]       Fax: [phone]   RxID:   [id number]

## 2010-08-15 NOTE — Miscellaneous (Signed)
Summary: Orders Update - labs  Clinical Lists Changes  Orders: Added new Test order of T-CBC w/Diff [id number]) - Signed Added new Test order of T-CD4SP [facility]) (CD4SP) - Signed Added new Test order of T-Comprehensive Metabolic Panel [id number]) - Signed Added new Test order of T-HIV Viral Load [id number]) - Signed     Process Orders Check Orders Results:     [facility]: ABN not required for this insurance Order queued for requisitioning for [facility]: March 29, 2010 10:39 AM  Tests Sent for requisitioning (March 29, 2010 10:39 AM):     03/29/2010: [facility] -- T-CBC w/Diff [[id number]] (signed)     03/29/2010: [facility] -- T-Comprehensive Metabolic Panel [[id number]] (signed)     03/29/2010: [facility] -- T-HIV Viral Load [phone] (signed)

## 2010-08-15 NOTE — Miscellaneous (Signed)
Summary: RW Update  Clinical Lists Changes  Observations: Added new observation of PAYOR: No Insurance (05/19/2010 10:45) Added new observation of GENDER: Male (05/19/2010 10:45) Added new observation of RACE: African American (05/19/2010 10:45)

## 2010-08-15 NOTE — Miscellaneous (Signed)
Summary: Bridge Counselor  Clinical Lists Changes 04/13/10-BC phone client to remind about scheduled medical appt 04/14/10 @ 10:30.  BC will tranpsort client to scheduled appt. 04/14/10-BC phone client to remind about scheduled medical appt for today @ 10:30. BC will let client transport himself to the appt and [facility] will meet him in the clinic once he arrives.  BC met client in the clinic.  Client met with Dr. Philipp Deputy.  BC did not sit in on medical appt visit because [facility] noticed that client hestitated saying "yes" to the nurse when she asked if he needed condoms.  BC wanted client to feel free to be honest and open during his medical visits. BC spoke with Kandice Robinsons, eligibility worker about meeting with client in order to get an orange card.  Britta Mccreedy will not be able to meet with client until after 1:30 due to having another client.  Client unable to wait until 1:30 due to having to go into work so therefore client/Barbara agreed to meet Wed morning, 10/05.  Client met with Paulo Fruit and completed ADAP and RW information.  Client will need to bring Dallie Piles pay stubs and once Byrd Hesselbach receives that, client's ADAP and RW will be complete.  Client will either bring or fax Byrd Hesselbach pay check stubbs. Client informed BC that he has been [facility] (sore on foot healed). Client states that his blood pressure has been good but sugar level high.  Client stated that he will speak with the doctor about whether or not he needs to change or adjust diabetes meds.

## 2010-08-15 NOTE — Miscellaneous (Signed)
  Clinical Lists Changes  Observations: Added new observation of RWTITLE: B (06/07/2010 13:50) Added new observation of AIDSDAP: Yes 2011 (06/07/2010 13:50) Added new observation of PCTFPL: 148.59  (06/07/2010 13:50) Added new observation of INCOMESOURCE: wages  (06/07/2010 13:50) Added new observation of HOUSEINCOME: 28413  (06/07/2010 13:50) Added new observation of #CHILD<18 IN: No  (06/07/2010 13:50) Added new observation of FAMILYSIZE: 2  (06/07/2010 13:50) Added new observation of HOUSING: Stable/permanent  (06/07/2010 13:50) Added new observation of FINASSESSDT: 04/19/2010  (06/07/2010 13:50) Added new observation of YEARLYEXPEN: 2476  (06/07/2010 13:50) Added new observation of MARITAL STAT: Single  (06/07/2010 13:50) Added new observation of REC_MESSAGE: Yes  (06/07/2010 13:50) Added new observation of RECPHONECALL: Yes  (06/07/2010 13:50) Added new observation of REC_MAIL: Yes  (06/07/2010 13:50) Added new observation of RW VITAL STA: Active  (06/07/2010 13:50) Added new observation of PATNTCOUNTY: [place]  (06/07/2010 13:50) Added new observation of LATINO/HISP: No  (06/07/2010 13:50)

## 2010-08-15 NOTE — Letter (Signed)
[facility]   Imported By: Florinda Marker 04/27/2010 08:57:27  _____________________________________________________________________  External Attachment:    Type:   Image     Comment:   External Document

## 2010-08-15 NOTE — Miscellaneous (Signed)
Summary: RW update  Clinical Lists Changes  Observations: Added new observation of RWPARTICIP: Yes (04/06/2010 9:47)

## 2010-08-15 NOTE — Miscellaneous (Signed)
Summary: Bridge Counselor  Clinical Lists Changes  0/21/11-BC informed by supervisor Sharol Roussel that client is scheduled for a medical appt.  BC unaware that client has an appt today because did not attend last medical appt with client in order to give client privacy with the nurse and doctor.  [facility] phone client, client unaware of the fact that he had an appt scheduled for today.  Client phone [facility] to reschedule and appt is rescheduled for 05/08/10 @ 10:45.  Client apologized for missing appt and honestly did not know he had an appt scheduled for today.  BC will transport client to 10/24 appt. BC received an email from supervior Sharol Roussel that Dr. Philipp Deputy is very concerned about client because client's counts show that client is not taking his medications as prescribed though genotype completed 04/14/10 showed no form of resistance.  Kim informed BC that Dr. Philipp Deputy would like for [facility] to provide tx adherence.  Client's diabetes is out of control as well according to Dr. Philipp Deputy.  BC phone cleint to inform client that Dr. Philipp Deputy is very concerned about him especially because his VL is high and diabetes is out of control.  BC inquired if client was taking his medications and client assured [facility] that he is taking his medications as prescribed.  05/08/10-Client transported himself to scheduled appt and [facility] met client in the clinic.  BC attended appt with client.  Client assured Dr. Philipp Deputy that he is taking his HIV medications as prescribed.  Dr. Philipp Deputy inquired about how client was taking meds if have not received medications since the spring of 2011 and since medicaid was terminated.  Client stated that he was shipped an extra supply of medications and that he still has some bottles left from shipment.  Client stated that he has started back running as it related to exerciseing in order to get his diabetes and weight under control.  Client informed BC and Dr. Philipp Deputy that he has been approvef for ADAP.  BC will assist  client with tx adherence and scheduled to meet with client 05/18/10 @ 10:45.  BC will also check meds client was shipped this past spring.  05/18/10-BC met with client in BC's vehicle (unable to meet with client in his home due to client's son being home and is unaware of client's HIV status) to do tx adherence.  BC watched client place his medications in the appropriate section of the pill box.  BC will follow-up with client taking his medications and check pill box on next week.

## 2010-08-15 NOTE — Assessment & Plan Note (Signed)
Summary: 3WK F/U/VS   CC:  3 week follow up.  History of Present Illness: Pt here for f/u.  He c/oright foot pain and would like to see a Podiatrist.  Ulcer has healed. He also needs to get enrolled in ADAP since he lost his medicaid.  He was off his HIV meds for about a week but is backon them now.  Preventive Screening-Counseling & Management  Alcohol-Tobacco     Alcohol drinks/day: occassionally     Alcohol type: beer     Smoking Status: never  Caffeine-Diet-Exercise     Caffeine use/day: tea     Does Patient Exercise: yes     Type of exercise: jogging  Safety-Violence-Falls     Seat Belt Use: yes   Updated Prior Medication List: NORVIR 100 MG CAPS (RITONAVIR) Take 1 capsule by mouth once a day REYATAZ 300 MG CAPS (ATAZANAVIR SULFATE) Take 1 capsule by mouth once a day VASOTEC 5 MG  TABS (ENALAPRIL MALEATE) Take 1 tablet by mouth once a day GLUCOPHAGE 1000 MG  TABS (METFORMIN HCL) Take 1 tablet by mouth two times a day GLUCOTROL XL 10 MG  TB24 (GLIPIZIDE) Take 1 tablet by mouth once a day TRUVADA 200-300 MG TABS (EMTRICITABINE-TENOFOVIR) Take 1 tablet by mouth once a day VICODIN 5-500 MG TABS (HYDROCODONE-ACETAMINOPHEN) Take 1 tablet by mouth every 8 hours as needed AMARYL 2 MG TABS (GLIMEPIRIDE) Take 1 tablet by mouth once a day PRAVASTATIN SODIUM 40 MG TABS (PRAVASTATIN SODIUM) Take 1 tablet by mouth once a day VIAGRA 25 MG TABS (SILDENAFIL CITRATE) take as directed  Current Allergies (reviewed today): ! SULFA ! * DAPSONE Past History:  Past Medical History: Last updated: 01/30/2007 Diabetes mellitus, type II HIV disease Hyperlipidemia Hypertension  Review of Systems  The patient denies anorexia, fever, weight loss, chest pain, and dyspnea on exertion.    Vital Signs:  Patient profile:   45 year old male Height:      70 inches (177.80 cm) Weight:      206.12 pounds (93.69 kg) BMI:     29.68 Temp:     97.6 degrees F (36.44 degrees C) oral Pulse rate:   74  / minute BP sitting:   147 / 97  (left arm)  Vitals Entered By: Baxter Hire) (May 08, 2010 10:59 AM) CC: 3 week follow up Pain Assessment Patient in pain? no      Nutritional Status BMI of 25 - 29 = overweight Nutritional Status Detail appetite is good per patient  Have you ever been in a relationship where you felt threatened, hurt or afraid?Unable to ask   Does patient need assistance? Functional Status Self care Ambulation Normal   Physical Exam  General:  alert, well-developed, well-nourished, and well-hydrated.   Head:  normocephalic and atraumatic.   Mouth:  pharynx pink and moist.   Lungs:  normal breath sounds.      Impression & Recommendations:  Problem # 1:  HIV DISEASE (ICD-042) genotype shows no resistance.  Pt encouraged to take his meds every day. He will return in 3 months for repeat labs. Diagnostics Reviewed:  CD4: 360 (03/30/2010)   WBC: 3.3 (03/29/2010)   Hgb: 15.3 (03/29/2010)   HCT: 46.4 (03/29/2010)   Platelets: 192 (03/29/2010) HIV genotype: See Comment (04/14/2010)   HIV-1 RNA: 77500 (03/29/2010)     Problem # 2:  SKIN RASH (ICD-782.1)  Orders: Dermatology Referral (Derma)  Problem # 3:  FOOT PAIN, RIGHT (ICD-729.5)  Orders: Podiatry Referral (Podiatry)  Problem # 4:  DIABETES MELLITUS, TYPE II (ICD-250.00)  continue current meds check HgbA1c His updated medication list for this problem includes:    Vasotec 5 Mg Tabs (Enalapril maleate) .Marland Kitchen... Take 1 tablet by mouth once a day    Glucophage 1000 Mg Tabs (Metformin hcl) .Marland Kitchen... Take 1 tablet by mouth two times a day    Glucotrol Xl 10 Mg Tb24 (Glipizide) .Marland Kitchen... Take 1 tablet by mouth once a day    Amaryl 2 Mg Tabs (Glimepiride) .Marland Kitchen... Take 1 tablet by mouth once a day  Orders: T-Hgb A1C (in-house) ([id number])  Other Orders: Est. Patient Level III (74259) Future Orders: T-CD4SP ([facility]) (CD4SP) ... 08/06/2010 T-HIV Viral Load [phone]) ... 08/06/2010 T-Comprehensive  Metabolic Panel [phone]) ... 08/06/2010 T-CBC w/Diff ([id number]) ... 08/06/2010  Patient Instructions: 1)  Please schedule a follow-up appointment in 3 months, 2 weeks after labs.   Laboratory Results   Blood Tests   Date/Time Received: Mariea Clonts  May 08, 2010 3:12 PM  Date/Time Reported: Mariea Clonts  May 08, 2010 3:12 PM   HGBA1C: 12.7%   (Normal Range: Non-Diabetic - 3-6%   Control Diabetic - 6-8%)

## 2010-08-15 NOTE — Letter (Signed)
Summary: Juanell Fairly Application  Ryan White Application   Imported By: Florinda Marker 04/21/2010 16:09:00  _____________________________________________________________________  External Attachment:    Type:   Image     Comment:   External Document

## 2010-08-15 NOTE — Assessment & Plan Note (Signed)
Summary: WORK IN/VS   CC:  pt. c/o right fourth toe red and painful X 1 week.  History of Present Illness: Pt noted that his right 4th toe was red and painful for the last week or so.  He does not recall any injury.  No fever or chills.  He has been soaking it in warm water. Pt recenlty lost his Medicaid but still has a few months of his meds.  Preventive Screening-Counseling & Management  Alcohol-Tobacco     Alcohol type: occ     Smoking Status: never  Caffeine-Diet-Exercise     Caffeine use/day: 1     Does Patient Exercise: no  Hep-HIV-STD-Contraception     HIV Risk: no  Safety-Violence-Falls     Seat Belt Use: 75      Drug Use:  no.     Updated Prior Medication List: NORVIR 100 MG CAPS (RITONAVIR) Take 1 capsule by mouth once a day REYATAZ 300 MG CAPS (ATAZANAVIR SULFATE) Take 1 capsule by mouth once a day VASOTEC 5 MG  TABS (ENALAPRIL MALEATE) Take 1 tablet by mouth once a day GLUCOPHAGE 1000 MG  TABS (METFORMIN HCL) Take 1 tablet by mouth two times a day GLUCOTROL XL 10 MG  TB24 (GLIPIZIDE) Take 1 tablet by mouth once a day TRUVADA 200-300 MG TABS (EMTRICITABINE-TENOFOVIR) Take 1 tablet by mouth once a day VICODIN 5-500 MG TABS (HYDROCODONE-ACETAMINOPHEN) Take 1 tablet by mouth every 8 hours as needed AMARYL 2 MG TABS (GLIMEPIRIDE) Take 1 tablet by mouth once a day PRAVASTATIN SODIUM 40 MG TABS (PRAVASTATIN SODIUM) Take 1 tablet by mouth once a day DOXYCYCLINE HYCLATE 100 MG CAPS (DOXYCYCLINE HYCLATE) Take 1 tablet by mouth two times a day  Current Allergies (reviewed today): ! SULFA ! * DAPSONE Past History:  Past Medical History: Last updated: 01/30/2007 Diabetes mellitus, type II HIV disease Hyperlipidemia Hypertension  Review of Systems  The patient denies anorexia, fever, and weight loss.    Vital Signs:  Patient profile:   45 year old male Height:      70 inches (177.80 cm) Weight:      207 pounds (94.09 kg) BMI:     29.81 Temp:     98.2 degrees  F (36.78 degrees C) oral Pulse rate:   76 / minute BP sitting:   166 / 102  (right arm)  Vitals Entered By: Wendall Mola CMA Duncan Dull) (March 29, 2010 11:14 AM) CC: pt. c/o right fourth toe red and painful X 1 week Is Patient Diabetic? Yes Did you bring your meter with you today? No Pain Assessment Patient in pain? yes     Location: right toe Intensity: 6 Type: aching Onset of pain  Constant Nutritional Status BMI of 25 - 29 = overweight Nutritional Status Detail appetite "good"  Does patient need assistance? Functional Status Self care Ambulation Normal Comments pt. missed about one week of meds when on atibiotics due to nausea   Physical Exam  General:  alert, well-developed, well-nourished, and well-hydrated.   Head:  normocephalic and atraumatic.   Mouth:  pharynx pink and moist.   Lungs:  normal breath sounds.   Extremities:  right 4 th toe with some erythema and pus around the nail bed.  The toenail is loose. No erythema of foot. No streaking   Impression & Recommendations:  Problem # 1:  CELLULITIS AND ABSCESS OF UNSPECIFIED DIGIT (ICD-681.9) pt to continue to soak foot doxycycline 100mg  two times a day for 10 days refer to  wound center He is to call if toe worsens or he devlops fever. Orders: Est. Patient Level III (95638) Wound Care Center Referral (Wound Care)  Medications Added to Medication List This Visit: 1)  Doxycycline Hyclate 100 Mg Caps (Doxycycline hyclate) .... Take 1 tablet by mouth two times a day  Other Orders: T-Hgb A1C (in-house) ([id number]) Prescriptions: DOXYCYCLINE HYCLATE 100 MG CAPS (DOXYCYCLINE HYCLATE) Take 1 tablet by mouth two times a day  #20 x 0   Entered and Authorized by:   Yisroel Ramming MD   Signed by:   Yisroel Ramming MD on 03/29/2010   Method used:   Print then Give to Patient   RxID:   [id number]

## 2010-08-15 NOTE — Progress Notes (Signed)
Summary: med refill  Phone Note Refill Request Message from:  Fax from Pharmacy on September 05, 2009 9:38 AM  Refills Requested: Medication #1:  AMARYL 2 MG TABS Take 1 tablet by mouth once a day   Dosage confirmed as above?Dosage Confirmed   Brand Name Necessary? No   Supply Requested: 1 month  Method Requested: Telephone to Pharmacy Next Appointment Scheduled: seen 08/25/09 Initial call taken by: Sharen Heck RN,  September 05, 2009 9:38 AM    Prescriptions: AMARYL 2 MG TABS (GLIMEPIRIDE) Take 1 tablet by mouth once a day  #30 x 5   Entered by:   Sharen Heck RN   Authorized by:   Yisroel Ramming MD   Signed by:   Yisroel Ramming MD on 09/05/2009   Method used:   Telephoned to ...       [facility] [employer] (mail-order)       [address] [place], [place]  [address]       Ph: [phone]       Fax: [phone]   RxID:   [id number]

## 2010-08-15 NOTE — Miscellaneous (Signed)
Summary: ncadap/RW pending additional information  Clinical Lists Changes

## 2010-08-15 NOTE — Miscellaneous (Signed)
Summary: Bridge Counselor  Clinical Lists Changes  03/27/10-Bridge Counselor Ferd Glassing connected client with [employer]..completed intake/assessment.  BC phone [facility] to schedule client lab/medical appt.  Lab appt scheduled for 03/29/10 @ 10:30 and medical appt scheduled for 04/14/10 @ 10:30.  03/29/10-Bridge Counselor Tonja transported client to lab appt.  BC noticed that client was limping.  Client informed BC that he has a sore on one of his toes and it hurts when he walks.  BC mention to Tomasita Morrow, RN about client's toe and requested that it be examined especially due to client being diabetic.  Tammy and Dr. Philipp Deputy examined client's toe.  Client recieved an anitiboitc for toe.  Client will pick up antibotic tonight.  Marylen Ponto, CNA informed BC that Dr. Philipp Deputy would like for client to be seen at the [facility].  W[facility] Center appt scheduled for 04/05/10 @ 12:30; [facility] will transport client.  BC left client a message via voicemail about Wound Care appt. Ferd Glassing.

## 2010-08-23 ENCOUNTER — Ambulatory Visit: Payer: Self-pay | Admitting: Internal Medicine

## 2010-09-04 ENCOUNTER — Encounter (INDEPENDENT_AMBULATORY_CARE_PROVIDER_SITE_OTHER): Payer: Self-pay | Admitting: *Deleted

## 2010-09-12 NOTE — Miscellaneous (Signed)
  Clinical Lists Changes  Observations: Added new observation of HIV RISK BEH: Heterosexual contact (09/04/2010 11:41)

## 2010-09-20 ENCOUNTER — Encounter: Payer: Self-pay | Admitting: Infectious Diseases

## 2010-09-20 ENCOUNTER — Ambulatory Visit (INDEPENDENT_AMBULATORY_CARE_PROVIDER_SITE_OTHER): Payer: Self-pay | Admitting: Infectious Diseases

## 2010-09-20 DIAGNOSIS — B20 Human immunodeficiency virus [HIV] disease: Secondary | ICD-10-CM

## 2010-09-25 LAB — POCT URINALYSIS DIPSTICK
Glucose, UA: 500 mg/dL — AB
Ketones, ur: NEGATIVE mg/dL
Protein, ur: NEGATIVE mg/dL
Specific Gravity, Urine: 1.015 (ref 1.005–1.030)

## 2010-09-25 LAB — HIV 1/2 CONFIRMATION: HIV-1 antibody: POSITIVE

## 2010-09-25 LAB — GC/CHLAMYDIA PROBE AMP, GENITAL: Chlamydia, DNA Probe: NEGATIVE

## 2010-09-26 NOTE — Assessment & Plan Note (Signed)
Summary: resch from 2/8 f/u lab [mkj]   Vital Signs:  Patient profile:   45 year old male Height:      70 inches (177.80 cm) Weight:      215.50 pounds (97.95 kg) BMI:     31.03 Temp:     97.4 degrees F (36.33 degrees C) oral Pulse rate:   90 / minute BP sitting:   144 / 87  (left arm)  Vitals Entered By: Starleen Arms CMA (September 20, 2010 3:38 PM) CC: f/u Is Patient Diabetic? Yes Did you bring your meter with you today? No Pain Assessment Patient in pain? no      Nutritional Status BMI of > 30 = obese  Does patient need assistance? Functional Status Self care Ambulation Normal   CC:  f/u.  History of Present Illness: 45 yo M with hx of diabetes, increased lipids, HIV+ with last CD4 480 and VL 33. Currently on ATVr/TRV. Has been on this since inception. Dx HIV+ 2004 (?), pt unsure.  Doing well, FSGs have been ?  he ran out of strips. Needs podiatry appt. Throbbing pain in his R foot.   Preventive Screening-Counseling & Management  Alcohol-Tobacco     Alcohol drinks/day: <1     Alcohol type: all     Smoking Status: never  Caffeine-Diet-Exercise     Does Patient Exercise: yes     Type of exercise: running      Drug Use:  no.    Current Medications (verified): 1)  Norvir 100 Mg Caps (Ritonavir) .... Take 1 Capsule By Mouth Once A Day 2)  Reyataz 300 Mg Caps (Atazanavir Sulfate) .... Take 1 Capsule By Mouth Once A Day 3)  Vasotec 5 Mg  Tabs (Enalapril Maleate) .... Take 1 Tablet By Mouth Once A Day 4)  Glucophage 1000 Mg  Tabs (Metformin Hcl) .... Take 1 Tablet By Mouth Two Times A Day 5)  Glucotrol Xl 10 Mg  Tb24 (Glipizide) .... Take 1 Tablet By Mouth Once A Day 6)  Truvada 200-300 Mg Tabs (Emtricitabine-Tenofovir) .... Take 1 Tablet By Mouth Once A Day 7)  Pravastatin Sodium 40 Mg Tabs (Pravastatin Sodium) .... Take 1 Tablet By Mouth Once A Day 8)  Viagra 25 Mg Tabs (Sildenafil Citrate) .... Take As Directed  Allergies: 1)  ! Sulfa 2)  ! * Dapsone  Past  History:  Past Medical History: Last updated: 01/30/2007 Diabetes mellitus, type II HIV disease Hyperlipidemia Hypertension  Family History: HTN in sister and mom  Social History: Never Smoked Alcohol use-yes Drug use-no  Physical Exam  General:  well-developed, well-nourished, and well-hydrated.   Eyes:  pupils equal, pupils round, and pupils reactive to light.   Mouth:  pharynx pink and moist and no exudates.   Neck:  no masses.   Lungs:  normal respiratory effort and normal breath sounds.   Heart:  normal rate, regular rhythm, and no murmur.   Abdomen:  soft, non-tender, and normal bowel sounds.    Diabetes Management Exam:    Foot Exam (with socks and/or shoes not present):       Sensory-Pinprick/Light touch:          Left dorsal foot (L-5): normal          Right dorsal foot (L-5): normal       Inspection:          Left foot: abnormal             Comments: mild skin breakdown  betw L 3-4 and 4-5 toes.           Right foot: abnormal             Comments: mild skin breakdown between R 3-4 and 4-5 toes       Nails:          Left foot: normal          Right foot: normal        Medication Adherence: 09/20/2010   Adherence to medications reviewed with patient. Counseling to provide adequate adherence provided   Prevention For Positives: 09/20/2010   Safe sex practices discussed with patient. Condoms offered.                             Impression & Recommendations:  Problem # 1:  HIV DISEASE (ICD-042)  will con this current rx's . he has condoms. he needs to get lipids, RPR, RW flow sheet filled out. need to check Hep A as well.  will see him back in 4-5 months.   Orders: Ophthalmology Referral (Ophthalmology) Podiatry Referral (Podiatry)Future Orders: T-Hepatitis C Antibody ([id number]) ... 12/19/2010 T-Hepatitis A Antibody ([id number]) ... 12/19/2010  Problem # 2:  HYPERLIPIDEMIA (ICD-272.4) will recheck his lipids next visit.  His updated  medication list for this problem includes:    Pravastatin Sodium 40 Mg Tabs (Pravastatin sodium) .Marland Kitchen... Take 1 tablet by mouth once a day  Problem # 3:  HYPERTENSION (ICD-401.9) will ask for IM eval. his BP is borderline now.  His updated medication list for this problem includes:    Vasotec 5 Mg Tabs (Enalapril maleate) .Marland Kitchen... Take 1 tablet by mouth once a day  Problem # 4:  ERECTILE DYSFUNCTION (ICD-302.72) he will obtain this med online. cautioned against using more than 25mg  at daily.  His updated medication list for this problem includes:    Viagra 25 Mg Tabs (Sildenafil citrate) .Marland Kitchen... Take as directed  Problem # 5:  DIABETES MELLITUS, TYPE II (ICD-250.00)  will have him eval by IM. will have him eval by podiatry and ophtho. he is unclear of the name of his test strips, he will call us back with this.  The following medications were removed from the medication list:    Amaryl 2 Mg Tabs (Glimepiride) .Marland Kitchen... Take 1 tablet by mouth once a day His updated medication list for this problem includes:    Vasotec 5 Mg Tabs (Enalapril maleate) .Marland Kitchen... Take 1 tablet by mouth once a day    Glucophage 1000 Mg Tabs (Metformin hcl) .Marland Kitchen... Take 1 tablet by mouth two times a day    Glucotrol Xl 10 Mg Tb24 (Glipizide) .Marland Kitchen... Take 1 tablet by mouth once a day  Orders: Ophthalmology Referral (Ophthalmology) Podiatry Referral (Podiatry)  Other Orders: Est. Patient Level IV (62952) Future Orders: T-CD4SP ([facility]) (CD4SP) ... 12/19/2010 T-HIV Viral Load [phone]) ... 12/19/2010 T-Comprehensive Metabolic Panel [phone]) ... 12/19/2010 T-CBC w/Diff ([id number]) ... 12/19/2010 T-RPR (Syphilis) [phone]) ... 12/19/2010 T-Lipid Profile [phone]) ... 12/19/2010    Orders Added: 1)  Est. Patient Level IV [01601] 2)  T-CD4SP [facility]) [CD4SP] 3)  T-HIV Viral Load [phone] 4)  T-Comprehensive Metabolic Panel [[id number]] 5)  T-CBC w/Diff [[id number]] 6)  T-RPR (Syphilis)  [[id number]] 7)  T-Lipid Profile [80061-22930] 8)  T-Hepatitis C Antibody [[id number]] 9)  T-Hepatitis A Antibody [[id number]] 10)  Ophthalmology Referral [Ophthalmology] 11)  Podiatry Referral [Podiatry]

## 2010-09-28 LAB — T-HELPER CELL (CD4) - (RCID CLINIC ONLY)
CD4 % Helper T Cell: 18 % — ABNORMAL LOW (ref 33–55)
CD4 T Cell Abs: 360 uL — ABNORMAL LOW (ref 400–2700)

## 2010-09-28 LAB — GLUCOSE, CAPILLARY: Glucose-Capillary: 380 mg/dL — ABNORMAL HIGH (ref 70–99)

## 2010-10-09 ENCOUNTER — Encounter: Payer: Self-pay | Admitting: Infectious Diseases

## 2010-10-09 ENCOUNTER — Other Ambulatory Visit: Payer: Self-pay | Admitting: *Deleted

## 2010-10-09 DIAGNOSIS — N529 Male erectile dysfunction, unspecified: Secondary | ICD-10-CM

## 2010-10-09 MED ORDER — SILDENAFIL CITRATE 25 MG PO TABS: 25.0000 mg | ORAL_TABLET | ORAL | Status: DC | PRN

## 2010-10-12 ENCOUNTER — Telehealth: Payer: Self-pay | Admitting: *Deleted

## 2010-10-12 DIAGNOSIS — M549 Dorsalgia, unspecified: Secondary | ICD-10-CM

## 2010-10-12 MED ORDER — CYCLOBENZAPRINE HCL 10 MG PO TABS: 10.0000 mg | ORAL_TABLET | Freq: Three times a day (TID) | ORAL | Status: DC | PRN

## 2010-10-12 MED ORDER — HYDROCODONE-ACETAMINOPHEN 5-500 MG PO TABS: 1.0000 | ORAL_TABLET | Freq: Four times a day (QID) | ORAL | Status: DC | PRN

## 2010-10-12 NOTE — Telephone Encounter (Signed)
Pt. Requesting refill for Vicodin which was removed from his medication profile at his last OV with Dr. Ninetta Lights.  He is also requesting Flexeril which is not on his medication profile.  Please advise.  Jennet Maduro, RN

## 2010-10-12 NOTE — Telephone Encounter (Signed)
i will refill.

## 2010-10-13 ENCOUNTER — Other Ambulatory Visit: Payer: Self-pay | Admitting: Licensed Clinical Social Worker

## 2010-10-13 DIAGNOSIS — N529 Male erectile dysfunction, unspecified: Secondary | ICD-10-CM

## 2010-10-16 ENCOUNTER — Other Ambulatory Visit: Payer: Self-pay | Admitting: *Deleted

## 2010-10-16 ENCOUNTER — Other Ambulatory Visit: Payer: Self-pay | Admitting: Licensed Clinical Social Worker

## 2010-10-16 DIAGNOSIS — N529 Male erectile dysfunction, unspecified: Secondary | ICD-10-CM

## 2010-10-16 DIAGNOSIS — M549 Dorsalgia, unspecified: Secondary | ICD-10-CM

## 2010-10-16 MED ORDER — SILDENAFIL CITRATE 25 MG PO TABS: 25.0000 mg | ORAL_TABLET | ORAL | Status: DC | PRN

## 2010-10-16 MED ORDER — HYDROCODONE-ACETAMINOPHEN 5-500 MG PO TABS: 1.0000 | ORAL_TABLET | Freq: Four times a day (QID) | ORAL | Status: DC | PRN

## 2010-10-16 MED ORDER — CYCLOBENZAPRINE HCL 10 MG PO TABS: 10.0000 mg | ORAL_TABLET | Freq: Three times a day (TID) | ORAL | Status: AC | PRN

## 2010-10-19 LAB — URINALYSIS, ROUTINE W REFLEX MICROSCOPIC
Bilirubin Urine: NEGATIVE
Ketones, ur: NEGATIVE mg/dL
Leukocytes, UA: NEGATIVE
Nitrite: NEGATIVE
Urobilinogen, UA: 0.2 mg/dL (ref 0.0–1.0)
pH: 6 (ref 5.0–8.0)

## 2010-10-19 LAB — GC/CHLAMYDIA PROBE AMP, GENITAL: Chlamydia, DNA Probe: NEGATIVE

## 2010-10-25 ENCOUNTER — Telehealth: Payer: Self-pay | Admitting: Infectious Diseases

## 2010-10-25 NOTE — Telephone Encounter (Signed)
Application for Viagra through [employer] - Connection to Care is completed and will be mailed today.Marland KitchenMarland KitchenAntionette Fairy

## 2010-11-09 ENCOUNTER — Other Ambulatory Visit: Payer: Self-pay | Admitting: *Deleted

## 2010-11-09 ENCOUNTER — Telehealth: Payer: Self-pay | Admitting: *Deleted

## 2010-11-09 NOTE — Telephone Encounter (Signed)
Patient notified that patient assistance medication for Viagra has arrived. Wendall Mola CMA

## 2010-11-09 NOTE — Telephone Encounter (Signed)
Pt's PAP medication arrived and is ready for pick-up at the Center. Jennet Maduro, RN

## 2010-11-27 ENCOUNTER — Other Ambulatory Visit: Payer: Self-pay | Admitting: *Deleted

## 2010-11-27 DIAGNOSIS — B20 Human immunodeficiency virus [HIV] disease: Secondary | ICD-10-CM

## 2010-11-27 MED ORDER — ATAZANAVIR SULFATE 300 MG PO CAPS: 300.0000 mg | ORAL_CAPSULE | Freq: Every day | ORAL | Status: DC

## 2010-11-27 MED ORDER — RITONAVIR 100 MG PO CAPS
100.0000 mg | ORAL_CAPSULE | Freq: Every day | ORAL | Status: DC
Start: 2010-11-27 — End: 2011-01-26

## 2010-11-27 MED ORDER — EMTRICITABINE-TENOFOVIR DF 200-300 MG PO TABS: 1.0000 | ORAL_TABLET | Freq: Every day | ORAL | Status: DC

## 2010-11-28 NOTE — Procedures (Signed)
DUPLEX DEEP VENOUS EXAM - LOWER EXTREMITY   INDICATION:  Ulcers on the toes.  History of diabetes.   HISTORY:  Edema:  No.  Trauma/Surgery:  No.  Pain:  Yes.  PE:  No.  Previous DVT:  No.  Anticoagulants:  No.  Other:   DUPLEX EXAM:                CFV   SFV   PopV  PTV    GSV                R  L  R  L  R  L  R   L  R  L  Thrombosis    o  o  o  o  o  o  o   o  o  o  Spontaneous   +  +  +  +  +  +  +   +  +  +  Phasic        +  +  +  +  +  +  +   +  +  +  Augmentation  +  +  +  +  +  +  +   +  +  +  Compressible  +  +  +  +  +  +  +   +  +  +  Competent     +  +  +  +  +  +  +   +  +  +   Legend:  + - yes  o - no  p - partial  D - decreased   IMPRESSION:  No evidence of acute deep venous thrombosis or superficial  thrombophlebitis within the lower extremities.  No evidence of reflux  seen.    _____________________________  Larina Earthly, M.D.   OD/MEDQ  D:  04/28/2010  T:  04/28/2010  Job:  [id number]

## 2010-12-15 DIAGNOSIS — I639 Cerebral infarction, unspecified: Secondary | ICD-10-CM

## 2010-12-15 HISTORY — DX: Cerebral infarction, unspecified: I63.9

## 2011-01-07 ENCOUNTER — Inpatient Hospital Stay (HOSPITAL_COMMUNITY)
Admission: EM | Admit: 2011-01-07 | Discharge: 2011-01-10 | DRG: 065 | Disposition: A | Discharge status: Home Health | Payer: Medicaid Other | Attending: Internal Medicine | Admitting: Internal Medicine

## 2011-01-07 ENCOUNTER — Emergency Department (HOSPITAL_COMMUNITY): Payer: Medicaid Other

## 2011-01-07 DIAGNOSIS — Z21 Asymptomatic human immunodeficiency virus [HIV] infection status: Secondary | ICD-10-CM | POA: Diagnosis present

## 2011-01-07 DIAGNOSIS — I6350 Cerebral infarction due to unspecified occlusion or stenosis of unspecified cerebral artery: Principal | ICD-10-CM | POA: Diagnosis present

## 2011-01-07 DIAGNOSIS — I10 Essential (primary) hypertension: Secondary | ICD-10-CM | POA: Diagnosis present

## 2011-01-07 DIAGNOSIS — Z794 Long term (current) use of insulin: Secondary | ICD-10-CM

## 2011-01-07 DIAGNOSIS — Z7982 Long term (current) use of aspirin: Secondary | ICD-10-CM

## 2011-01-07 DIAGNOSIS — IMO0001 Reserved for inherently not codable concepts without codable children: Secondary | ICD-10-CM | POA: Diagnosis present

## 2011-01-07 DIAGNOSIS — Z882 Allergy status to sulfonamides status: Secondary | ICD-10-CM

## 2011-01-07 DIAGNOSIS — R42 Dizziness and giddiness: Secondary | ICD-10-CM | POA: Diagnosis present

## 2011-01-07 DIAGNOSIS — E871 Hypo-osmolality and hyponatremia: Secondary | ICD-10-CM | POA: Diagnosis present

## 2011-01-07 DIAGNOSIS — R17 Unspecified jaundice: Secondary | ICD-10-CM | POA: Diagnosis present

## 2011-01-07 LAB — CBC
Hemoglobin: 14.7 g/dL (ref 13.0–17.0)
Platelets: 186 10*3/uL (ref 150–400)
RBC: 4.88 MIL/uL (ref 4.22–5.81)
WBC: 6.3 10*3/uL (ref 4.0–10.5)

## 2011-01-07 LAB — COMPREHENSIVE METABOLIC PANEL
Albumin: 3.7 g/dL (ref 3.5–5.2)
BUN: 9 mg/dL (ref 6–23)
Creatinine, Ser: 0.85 mg/dL (ref 0.50–1.35)
Potassium: 3.8 mEq/L (ref 3.5–5.1)
Sodium: 132 mEq/L — ABNORMAL LOW (ref 135–145)
Total Bilirubin: 3.6 mg/dL — ABNORMAL HIGH (ref 0.3–1.2)
Total Protein: 7.9 g/dL (ref 6.0–8.3)

## 2011-01-07 LAB — DIFFERENTIAL
Basophils Absolute: 0 10*3/uL (ref 0.0–0.1)
Basophils Relative: 0 % (ref 0–1)
Eosinophils Absolute: 0 10*3/uL (ref 0.0–0.7)
Monocytes Relative: 7 % (ref 3–12)
Neutro Abs: 1.9 10*3/uL (ref 1.7–7.7)
Neutrophils Relative %: 30 % — ABNORMAL LOW (ref 43–77)

## 2011-01-07 LAB — URINALYSIS, ROUTINE W REFLEX MICROSCOPIC
Ketones, ur: 40 mg/dL — AB
Leukocytes, UA: NEGATIVE
Nitrite: NEGATIVE
pH: 5 (ref 5.0–8.0)

## 2011-01-07 LAB — GLUCOSE, CAPILLARY: Glucose-Capillary: 240 mg/dL — ABNORMAL HIGH (ref 70–99)

## 2011-01-07 LAB — LIPASE, BLOOD: Lipase: 17 U/L (ref 11–59)

## 2011-01-08 ENCOUNTER — Inpatient Hospital Stay (HOSPITAL_COMMUNITY): Payer: Medicaid Other

## 2011-01-08 LAB — CBC
Hemoglobin: 13.8 g/dL (ref 13.0–17.0)
MCH: 29.3 pg (ref 26.0–34.0)
MCV: 83.7 fL (ref 78.0–100.0)
Platelets: 209 10*3/uL (ref 150–400)
RBC: 4.71 MIL/uL (ref 4.22–5.81)

## 2011-01-08 LAB — COMPREHENSIVE METABOLIC PANEL
ALT: 14 U/L (ref 0–53)
AST: 19 U/L (ref 0–37)
Alkaline Phosphatase: 63 U/L (ref 39–117)
CO2: 24 mEq/L (ref 19–32)
Chloride: 98 mEq/L (ref 96–112)
GFR calc non Af Amer: >60 mL/min (ref >60)
Potassium: 4.7 mEq/L (ref 3.5–5.1)
Sodium: 131 mEq/L — ABNORMAL LOW (ref 135–145)
Total Bilirubin: 1.6 mg/dL — ABNORMAL HIGH (ref 0.3–1.2)

## 2011-01-08 LAB — GLUCOSE, CAPILLARY: Glucose-Capillary: 223 mg/dL — ABNORMAL HIGH (ref 70–99)

## 2011-01-08 LAB — CARDIAC PANEL(CRET KIN+CKTOT+MB+TROPI): Relative Index: 1.7 (ref 0.0–2.5)

## 2011-01-09 LAB — SEDIMENTATION RATE: Sed Rate: 11 mm/hr (ref 0–16)

## 2011-01-09 LAB — CBC
HCT: 36.8 % — ABNORMAL LOW (ref 39.0–52.0)
MCH: 29.4 pg (ref 26.0–34.0)
MCV: 83.8 fL (ref 78.0–100.0)
Platelets: 200 10*3/uL (ref 150–400)
RDW: 11.9 % (ref 11.5–15.5)

## 2011-01-09 LAB — BILIRUBIN, FRACTIONATED(TOT/DIR/INDIR)
Bilirubin, Direct: 0.2 mg/dL (ref 0.0–0.3)
Indirect Bilirubin: 1.9 mg/dL — ABNORMAL HIGH (ref 0.3–0.9)
Total Bilirubin: 2.1 mg/dL — ABNORMAL HIGH (ref 0.3–1.2)

## 2011-01-09 LAB — GLUCOSE, CAPILLARY
Glucose-Capillary: 275 mg/dL — ABNORMAL HIGH (ref 70–99)
Glucose-Capillary: 223 mg/dL — ABNORMAL HIGH (ref 70–99)
Glucose-Capillary: 267 mg/dL — ABNORMAL HIGH (ref 70–99)

## 2011-01-09 LAB — COMPREHENSIVE METABOLIC PANEL
ALT: 17 U/L (ref 0–53)
AST: 25 U/L (ref 0–37)
Albumin: 2.7 g/dL — ABNORMAL LOW (ref 3.5–5.2)
Alkaline Phosphatase: 55 U/L (ref 39–117)
Calcium: 8.1 mg/dL — ABNORMAL LOW (ref 8.4–10.5)
GFR calc Af Amer: >60 mL/min (ref >60)
Glucose, Bld: 206 mg/dL — ABNORMAL HIGH (ref 70–99)
Potassium: 3.5 mEq/L (ref 3.5–5.1)
Sodium: 135 mEq/L (ref 135–145)
Total Protein: 6 g/dL (ref 6.0–8.3)

## 2011-01-10 DIAGNOSIS — I6789 Other cerebrovascular disease: Secondary | ICD-10-CM

## 2011-01-10 LAB — GLUCOSE, CAPILLARY
Glucose-Capillary: 227 mg/dL — ABNORMAL HIGH (ref 70–99)
Glucose-Capillary: 205 mg/dL — ABNORMAL HIGH (ref 70–99)

## 2011-01-10 LAB — ANA: Anti Nuclear Antibody(ANA): NEGATIVE

## 2011-01-10 LAB — C3 COMPLEMENT: C3 Complement: 107 mg/dL (ref 90–180)

## 2011-01-10 LAB — FACTOR 5 LEIDEN

## 2011-01-10 LAB — PROTEIN C, TOTAL: Protein C, Total: 109 % (ref 72–160)

## 2011-01-10 LAB — HOMOCYSTEINE: Homocysteine: 10.6 umol/L (ref 4.0–15.4)

## 2011-01-10 LAB — LUPUS ANTICOAGULANT PANEL

## 2011-01-10 LAB — PROTEIN C ACTIVITY: Protein C Activity: 143 % — ABNORMAL HIGH (ref 75–133)

## 2011-01-11 LAB — BETA-2-GLYCOPROTEIN I ABS, IGG/M/A: Beta-2-Glycoprotein I IgA: 0 A Units (ref <20)

## 2011-01-11 LAB — COMPLEMENT, TOTAL: Compl, Total (CH50): >60 U/mL — ABNORMAL HIGH (ref 31–60)

## 2011-01-11 NOTE — H&P (Signed)
NAMEREMER, COUSE NO.:  [id number]  MEDICAL RECORD NO.:  [id number]  LOCATION:  3010                         FACILITY:  [facility]  PHYSICIAN:  Gordy Savers, MDDATE OF BIRTH:  September 15, 1965  DATE OF ADMISSION:  01/07/2011 DATE OF DISCHARGE:                             HISTORY & PHYSICAL   CHIEF COMPLAINT:  Nausea and lightheadedness.  HISTORY OF PRESENT ILLNESS:  The patient is a 45 year old African American male with a history of dyslipidemia and type 2 diabetes.  He was stable until 3 days ago when he had the onset of nausea, dizziness, lightheadedness, and fatigue.  Symptoms have been progressive for over the past few days.  Earlier this morning, he had an episode of vomiting that has not reoccurred.  Due to his persistent nausea and lightheadedness as well as fatigue, he presented to the ED for evaluation.  On arrival, he was noted to be afebrile.  Blood pressure was in a mildly elevated range.  Evaluation included a head CT that revealed a subacute left cerebellar infarct.  He is now admitted for further evaluation and treatment.  The patient denied any associated headaches, double vision, speech difficulty, or unsteady gait.  He was able to drive himself to the ED he states without difficulty.  No prior history of cerebrovascular disease, although his father died very recently from a left brain stroke at age 73.  He is now admitted to the hospital for further evaluation and treatment.  PAST MEDICAL HISTORY:  The patient has an 8-10 year history of treated type 2 diabetes.  He has been on Zetia for dyslipidemia.  He states that he was treated briefly for hypertension when his weight was in excess of 250 pounds.  No recent antihypertensive medications.  He takes no aspirin.  He has a history of osteoarthritis and otherwise his past medical history is fairly unremarkable.  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  MEDICAL REGIMEN: 1. Metformin 500 mg daily. 2. Glucotrol 2.5 mg daily. 3. Zetia 10 mg daily.  SOCIAL HISTORY:  The patient is single.  He has a 48 year old son.  He is [place] native.  His mother is aged 44, is in good health and lives here locally.  Father died recently of a stroke at age 49.  His siblings are healthy.  He is a nondrinker, nonsmoker.  He states that he obtains his health care at [facility].  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  No history of substance abuse.  REVIEW OF SYSTEMS:  CONSTITUTION:  Denies any fever, chills, or sweats. There has been some voluntary weight loss over the years.  HEENT: Denies headache, visual disturbances, or double vision.  He describes lightheadedness, but denies any true vertigo.  SKIN:  No rash or lesions.  CARDIOPULMONARY:  Denies any history of asthma, wheezing, palpitations, or syncope.  GENITOURINARY:  Denies urinary frequency or dysuria.  GI:  Positive for nausea and vomiting.  No change in bowel habits.  Denies abdominal pain.  ENDOCRINE:  Denies any polyuria, polydipsia, heat or cold intolerance.  PHYSICAL EXAMINATION:  GENERAL:  A well-developed, moderately obese male, pleasant, alert, in no distress with normal speech. VITAL SIGNS:  Temperature 98.3, blood pressure 140/86, heart rate 70, respiratory rate 16, and O2 saturation 98-100% on room air. NEURO:  The patient is alert and oriented.  Cranial nerves examination grossly intact.  He had normal muscle strength.  Reflexes were symmetrical.  Plantar reflexes were flexor.  He had poor finger-nose testing on the left, but heel-to-shin test on the left seemed fairly well preserved. HEENT:  Normal pupil responses.  Conjunctivae are clear.  Extraocular muscles were full.  He had mild physiologic nystagmus on right lateral gaze.  Oropharynx is benign. NECK:  No bruits or neck vein distention. CHEST:  Clear. CARDIOVASCULAR:   Normal S1 and S2.  No murmurs or gallops. ABDOMEN:  Obese, soft, and nontender.  No organomegaly.  External genitalia normal. MUSCULOSKELETAL:  Unremarkable without joint effusion or deformity. SKIN:  No rashes or lesions. EXTREMITIES:  Pedal pulses were symmetrical.  Dorsalis pedis pulses were difficult to palpate, but posterior tibial pulses were full.  Head CT revealed a left cerebellar infarct.  EKG revealed a normal sinus rhythm and was a normal tracing.  LABORATORY STUDIES:  Serum sodium 132, potassium of 3.8, CO2 content 21, BUN/creatinine normal, random blood sugar 287.  White count normal at 6.3, hemoglobin 14.7.  Urinalysis revealed positive glycosuria.  IMPRESSION: 1. Left cerebellar infarct. 2. Diabetes mellitus.  ADDITIONAL DIAGNOSES: 1. Hypercholesterolemia. 2. Obesity. 3. Osteoarthritis.  DISPOSITION:  The patient will be admitted to a telemetry setting. Neurology will be consulted as well as PT, OT, and Speech Therapy will also be consulted for a swallowing evaluation.  The patient will be evaluated by the inpatient diabetes coordinator for further diabetic instructions.  The patient will be placed on aspirin 325 mg daily.  The patient will be seen by Neurology to consider for a brain MRI/MRA.  The patient will have his blood pressure monitored carefully and will be considered for antihypertensive medication if appropriate.  At the present time, his blood pressure will be monitored off medication.  He will be placed on statin therapy.  A hemoglobin A1c will be monitored as well as serial blood sugars.  He will be started on basal Lantus insulin and orders written for sliding scale insulin coverage.     Gordy Savers, MD     PFK/MEDQ  D:  01/07/2011  T:  01/08/2011  Job:  [id number]  Electronically Signed by Eleonore Chiquito MD on 01/11/2011 02:33:33 PM

## 2011-01-23 ENCOUNTER — Other Ambulatory Visit (INDEPENDENT_AMBULATORY_CARE_PROVIDER_SITE_OTHER): Payer: Medicaid Other

## 2011-01-23 ENCOUNTER — Ambulatory Visit: Payer: Self-pay

## 2011-01-23 DIAGNOSIS — Z79899 Other long term (current) drug therapy: Secondary | ICD-10-CM

## 2011-01-23 DIAGNOSIS — Z113 Encounter for screening for infections with a predominantly sexual mode of transmission: Secondary | ICD-10-CM

## 2011-01-23 DIAGNOSIS — B20 Human immunodeficiency virus [HIV] disease: Secondary | ICD-10-CM

## 2011-01-23 LAB — LIPID PANEL
Cholesterol: 244 mg/dL — ABNORMAL HIGH (ref 0–200)
LDL Cholesterol: 172 mg/dL — ABNORMAL HIGH (ref 0–99)
VLDL: 25 mg/dL (ref 0–40)

## 2011-01-24 LAB — HEPATITIS C ANTIBODY: HCV Ab: NEGATIVE

## 2011-01-24 LAB — CBC WITH DIFFERENTIAL/PLATELET
Basophils Absolute: 0 10*3/uL (ref 0.0–0.1)
Basophils Relative: 1 % (ref 0–1)
Eosinophils Absolute: 0.2 10*3/uL (ref 0.0–0.7)
Eosinophils Relative: 4 % (ref 0–5)
HCT: 43.3 % (ref 39.0–52.0)
MCHC: 33.3 g/dL (ref 30.0–36.0)
Monocytes Absolute: 0.4 10*3/uL (ref 0.1–1.0)
Neutro Abs: 0.9 10*3/uL — ABNORMAL LOW (ref 1.7–7.7)
RDW: 12.5 % (ref 11.5–15.5)

## 2011-01-24 LAB — COMPLETE METABOLIC PANEL WITH GFR
Albumin: 4.6 g/dL (ref 3.5–5.2)
Alkaline Phosphatase: 47 U/L (ref 39–117)
CO2: 20 mEq/L (ref 19–32)
GFR, Est African American: >60 mL/min (ref >60)
GFR, Est Non African American: >60 mL/min (ref >60)
Glucose, Bld: 115 mg/dL — ABNORMAL HIGH (ref 70–99)
Potassium: 4.3 mEq/L (ref 3.5–5.3)
Sodium: 134 mEq/L — ABNORMAL LOW (ref 135–145)
Total Protein: 7.9 g/dL (ref 6.0–8.3)

## 2011-01-24 LAB — T-HELPER CELL (CD4) - (RCID CLINIC ONLY): CD4 T Cell Abs: 570 uL (ref 400–2700)

## 2011-01-24 LAB — HEPATITIS A ANTIBODY, IGM: Hep A IgM: NEGATIVE

## 2011-02-01 ENCOUNTER — Telehealth: Payer: Self-pay

## 2011-02-01 NOTE — Discharge Summary (Signed)
NAMEHELTON, OLESON NO.:  [id number]  MEDICAL RECORD NO.:  [id number]  LOCATION:  3010                         FACILITY:  [facility]  PHYSICIAN:  Calvert Cantor, M.D.     DATE OF BIRTH:  02/25/1966  DATE OF ADMISSION:  01/07/2011 DATE OF DISCHARGE:  01/10/2011                              DISCHARGE SUMMARY   PRIMARY CARE PHYSICIAN:  [facility].  The patient also [facility] for [facility].  PRESENTING COMPLAINT:  Nausea and lightheadedness.  DISCHARGE DIAGNOSES: 1. Acute cerebrovascular accident with vertigo. 2. Uncontrolled diabetes mellitus. 3. Hypertension. 4. Human immunodeficiency virus. 5. Mild hyponatremia on admission, now resolved.  DISCHARGE MEDICATIONS:  The patient was not taking any of his meds prior to admission.  The following are new meds. 1. Aspirin 81 mg daily. 2. NovoLog 5 units prior to each meal three times a day. 3. Lantus 18 units at bedtime.  The following medications are medications he was previously supposed to be taking, these are as follows: 1. Enalapril.  I have increased this from a dose of 5 mg a day he was     taking previously to 10 mg a day due to uncontrolled blood     pressure. 2. Metformin 1000 mg twice a day. 3. Norvir 100 mg daily. 4. Pravachol 40 mg daily. 5. Reyataz 300 mg daily. 6. Truvada 200/300 daily.  Following medications can continue to be held, apparently he was not taking them anyway. 1. Cyclobenzaprine 10 mg every 8 hours as needed for back spasms. 2. Glipizide XL 10 mg daily. 3. Vicodin 5/500 every 6 hours as needed for back pain. 4. Viagra 25 mg daily as needed.  CONSULTS DURING HOSPITAL STAY:  Neurology consult.  The patient was evaluated by Dr. Roseanne Reno.  RADIOLOGICAL IMAGING RESULTS: 1. CT head without contrast on June 24 revealed acute-to-subacute left     cerebellar infarct. 2. MRI brain January 08, 2011, revealed acute infarct affecting much of     the left posterior inferior  cerebral artery territory.  No     hemorrhage or midline shift.  There is mild atrophy and small     vessel disease. 3. MRA of the brain January 08, 2011, revealed focal occlusion of left     posterior inferior cerebellar artery correlates with the observed     pattern of infarction on MRI.  Left vertebral is patent. 4. Two-D echo performed January 08, 2011, revealed grade 1 diastolic     dysfunction without any systolic dysfunction. 5. TEE performed today reveals minimal aortic plaque and is otherwise     normal.  Official transcribe report is still pending.  HOSPITAL COURSE:  This is a 45 year old male who came into the hospital with a complaint of 3 days of nausea, dizziness, lightheadedness and fatigue.  The patient was found to have had a CVA.  PROBLEMS: 1. Acute cerebrovascular accident in posterior inferior cerebellar     artery territory.  This is most likely secondary to uncontrolled     diabetes and hypertension which have resulted in atherosclerosis.     The patient is still feeling dizzy and will require home health PT  and OT.  He is advised to strictly control his diabetes and     hypertension from here on. 2. Diabetes mellitus, uncontrolled.  Hemoglobin A1c was found to be     12.4.  The patient has been started on insulin.  He has had insulin     teaching.  He is advised to check his sugars at least 3 times a day     before meals and keep a log and take this back with him when he     returns to [facility] to have further titration of insulin done.     I am continuing his metformin, but I have held his glipizide. 3. Hypertension.  He has been started on Vasotec 5 mg due to continued     uncontrolled blood pressure.  I have increased this to 10 mg.  Due to the patient's young age and thrombosis, a hypercoagulable workup was ordered.  Thus far it appears that protein C is slightly elevated at 143, cut off being 133.  All other studies are negative so far.  ANA was done as  well which is negative.  As mentioned above, the patient has been advised about nutritional and lifestyle changes that needs to be made to help prevent further infarct. He is currently voicing agreement with the plan.  He has not been back to [facility] in the past 6 months and had been out of his medications. He is stating that he will return.  For now we will be giving him Lantus and NovoLog supplied by the hospital.  He will buy his own enalapril and metformin from [facility] and continue to obtain his HIV meds from the [facility].  PHYSICAL EXAMINATION:  NEUROLOGIC:  Cranial nerves II through XII intact.  Strength intact in all 4 extremities.  Finger-to-nose test is still off.  No sensory deficits. HEART:  Regular rate and rhythm.  No murmurs. LUNGS:  Clear bilaterally.  Normal respiratory effort.  No use of accessory muscles. ABDOMEN:  Soft, nontender, nondistended.  Bowel sounds positive. EXTREMITIES:  No cyanosis, clubbing or edema.  The patient will be receiving home health PT and OT.  He has been excused from his job for the next 30 days.  Condition on discharge stable.  Follow up with [facility] and [facility].  Time on discharge was 60 minutes     Calvert Cantor, M.D.     SR/MEDQ  D:  01/10/2011  T:  01/11/2011  Job:  [id number]  cc:   [facility]  Electronically Signed by Calvert Cantor M.D. on 02/01/2011 12:17:16 PM

## 2011-02-01 NOTE — Telephone Encounter (Signed)
Pt walked into clinic stating he does not have enough insulin to las through the week end.

## 2011-02-05 ENCOUNTER — Encounter: Payer: Self-pay | Admitting: Infectious Diseases

## 2011-02-05 ENCOUNTER — Ambulatory Visit (INDEPENDENT_AMBULATORY_CARE_PROVIDER_SITE_OTHER): Payer: Self-pay | Admitting: Infectious Diseases

## 2011-02-05 ENCOUNTER — Ambulatory Visit: Payer: Self-pay

## 2011-02-05 DIAGNOSIS — E119 Type 2 diabetes mellitus without complications: Secondary | ICD-10-CM

## 2011-02-05 DIAGNOSIS — J209 Acute bronchitis, unspecified: Secondary | ICD-10-CM

## 2011-02-05 DIAGNOSIS — B20 Human immunodeficiency virus [HIV] disease: Secondary | ICD-10-CM

## 2011-02-05 MED ORDER — AZITHROMYCIN 500 MG PO TABS: 500.0000 mg | ORAL_TABLET | Freq: Every day | ORAL | Status: DC

## 2011-02-05 MED ORDER — INSULIN GLARGINE 100 UNIT/ML ~~LOC~~ SOLN: 16.0000 [IU] | Freq: Every day | SUBCUTANEOUS | Status: DC

## 2011-02-05 NOTE — Telephone Encounter (Signed)
Pt walked into clinic c/o he may be out of his insulin prior to office visit with Dr Ninetta Lights.  He has scripts given to him from the ER but he is not able to pay for medication. He has not seen the financial counselor to qualify for services at this point. I contacted [facility] to get the price of medication and was told $125.00.   I spoke with Jamison Neighbor, RD who says the brand of the insulin can be changed to [facility] brand and he can purchase it for $ 25.00. Pt states he does not have the money.  I have tired every resource available at this point to patient and can not find any assistance. He is not a [facility] client.  I have given him a [facility] brochure and advised him to call for a case manager.   Pt was advised to use the insulin he has until he runs out. The only other option will be to go to the ED or urgent care for insulin injections or to seek assistance.   He will also need a primary care referral. I think Jamison Neighbor will be able to assist him with his insulin needs.  Tomasita Morrow, RN

## 2011-02-05 NOTE — Assessment & Plan Note (Signed)
Will decrease his lantus to 16 u at hs. He needs to see an IM MD.

## 2011-02-05 NOTE — Progress Notes (Signed)
  Subjective:    Patient ID: Tristan Pugh, male    DOB: 1965-07-29, 45 y.o.   MRN: [id number]  HPI 45 yo M with DM2 and HIV+ ~2004. Was recently adm to [facility] (January 08, 2011) with L cerebellar infarct, MRA showing L PICA occlusion. He also had been off his DM medication for some time as well (HgBA1C 12).  Last CD4 570 and VL 391, Trig 125 CHol 244 Glc 115 (01-23-11).  States since his CVA his thinking is slower. States his dad died 2 weeks ago and had spike in his FSG. But since has had lower sugars 69-90. Has changed his diet- baked only, fruits, vegetables. Has lost ~15#.  Has been coughing up grey sputum for 3 weeks. No f/c. Feels a little dizzy with coughing. Since CVA has weakness in LUE and decreased hearing in L ear. Wants to get another head CT.   Review of Systems     Objective:   Physical Exam  Constitutional: He appears well-developed and well-nourished.  Eyes: EOM are normal. Pupils are equal, round, and reactive to light.  Neck: Neck supple.  Cardiovascular: Normal rate, regular rhythm and normal heart sounds.   Pulmonary/Chest: Effort normal and breath sounds normal. He has no wheezes.  Abdominal: Soft. Bowel sounds are normal. He exhibits no distension. There is no tenderness.  Lymphadenopathy:    He has no cervical adenopathy.          Assessment & Plan:

## 2011-02-05 NOTE — Assessment & Plan Note (Signed)
He appears to have a recurrance. Will give him tripack.

## 2011-02-05 NOTE — Assessment & Plan Note (Signed)
His HIV is doing well, appears to be the least of his problems. Offered condoms. Will see him back in 3-4 months .

## 2011-02-06 ENCOUNTER — Emergency Department (HOSPITAL_COMMUNITY)
Admission: EM | Admit: 2011-02-06 | Discharge: 2011-02-06 | Disposition: A | Discharge status: Home / Self Care | Payer: Self-pay | Attending: Emergency Medicine | Admitting: Emergency Medicine

## 2011-02-06 ENCOUNTER — Other Ambulatory Visit: Payer: Self-pay | Admitting: *Deleted

## 2011-02-06 DIAGNOSIS — J209 Acute bronchitis, unspecified: Secondary | ICD-10-CM

## 2011-02-06 DIAGNOSIS — Z794 Long term (current) use of insulin: Secondary | ICD-10-CM | POA: Insufficient documentation

## 2011-02-06 DIAGNOSIS — H9209 Otalgia, unspecified ear: Secondary | ICD-10-CM | POA: Insufficient documentation

## 2011-02-06 DIAGNOSIS — Z79899 Other long term (current) drug therapy: Secondary | ICD-10-CM | POA: Insufficient documentation

## 2011-02-06 DIAGNOSIS — E7800 Pure hypercholesterolemia, unspecified: Secondary | ICD-10-CM | POA: Insufficient documentation

## 2011-02-06 DIAGNOSIS — E119 Type 2 diabetes mellitus without complications: Secondary | ICD-10-CM | POA: Insufficient documentation

## 2011-02-06 DIAGNOSIS — Z8673 Personal history of transient ischemic attack (TIA), and cerebral infarction without residual deficits: Secondary | ICD-10-CM | POA: Insufficient documentation

## 2011-02-06 DIAGNOSIS — IMO0002 Reserved for concepts with insufficient information to code with codable children: Secondary | ICD-10-CM | POA: Insufficient documentation

## 2011-02-06 MED ORDER — AZITHROMYCIN 500 MG PO TABS: 500.0000 mg | ORAL_TABLET | Freq: Every day | ORAL | Status: DC

## 2011-02-06 NOTE — Telephone Encounter (Signed)
He is concerned about cost of the zithromax. I checked the different formularies. It is not on the $4 list. Sent to [facility] at his request

## 2011-02-08 ENCOUNTER — Telehealth: Payer: Self-pay | Admitting: *Deleted

## 2011-02-08 NOTE — Telephone Encounter (Signed)
Patient scheduled appointment with [facility] for a PCP for 02/23/11. Wendall Mola CMA

## 2011-02-24 ENCOUNTER — Other Ambulatory Visit: Payer: Self-pay | Admitting: Infectious Diseases

## 2011-03-23 ENCOUNTER — Other Ambulatory Visit: Payer: Self-pay | Admitting: Infectious Diseases

## 2011-03-28 ENCOUNTER — Other Ambulatory Visit: Payer: Self-pay | Admitting: Infectious Diseases

## 2011-03-28 DIAGNOSIS — R52 Pain, unspecified: Secondary | ICD-10-CM

## 2011-04-21 ENCOUNTER — Other Ambulatory Visit: Payer: Self-pay | Admitting: Internal Medicine

## 2011-04-21 ENCOUNTER — Other Ambulatory Visit: Payer: Self-pay | Admitting: Infectious Diseases

## 2011-06-04 ENCOUNTER — Other Ambulatory Visit: Payer: Self-pay | Admitting: Infectious Diseases

## 2011-06-04 NOTE — H&P (Signed)
  NAME:  Tristan Pugh, PORTAL NO.:  [id number]  MEDICAL RECORD NO.:  [id number]          PATIENT TYPE:  OUT  LOCATION:  XRAY                         FACILITY:  [facility]  PHYSICIAN:  Ardath Sax, M.D.     DATE OF BIRTH:  August 10, 1965  DATE OF ADMISSION:  04/05/2010 DATE OF DISCHARGE:                             HISTORY & PHYSICAL  This is a 45 year old diabetic male who comes here probably after having had a paronychia on his right fourth toenail and he has been treated with doxycycline and the inflammation has gone down greatly and the nail is lifted off proximally.  I debrided this off and got down to nail bed where there were some debride, then I scrubbed off and we put him on a dressing with Polysporin and a Band-Aid.  He is instructed to do that and to keep taking a doxycycline, he should be fine.  Of note, his blood sugar was 280 and I told him to certainly control better with his diet and medication, so he will come back p.r.n.     Ardath Sax, M.D.    PP/MEDQ  D:  04/05/2010  T:  04/06/2010  Job:  [id number]  Electronically Signed by Ardath Sax  on 06/04/2011 01:20:50 PM

## 2011-06-25 ENCOUNTER — Other Ambulatory Visit: Payer: Self-pay | Admitting: *Deleted

## 2011-06-25 ENCOUNTER — Other Ambulatory Visit: Payer: Self-pay | Admitting: Licensed Clinical Social Worker

## 2011-06-25 ENCOUNTER — Telehealth: Payer: Self-pay | Admitting: *Deleted

## 2011-06-25 DIAGNOSIS — R52 Pain, unspecified: Secondary | ICD-10-CM

## 2011-06-25 DIAGNOSIS — B20 Human immunodeficiency virus [HIV] disease: Secondary | ICD-10-CM

## 2011-06-25 MED ORDER — ATAZANAVIR SULFATE 300 MG PO CAPS: 300.0000 mg | ORAL_CAPSULE | Freq: Every day | ORAL | Status: DC

## 2011-06-25 MED ORDER — EMTRICITABINE-TENOFOVIR DF 200-300 MG PO TABS: 1.0000 | ORAL_TABLET | Freq: Every day | ORAL | Status: DC

## 2011-06-25 MED ORDER — RITONAVIR 100 MG PO TABS: 100.0000 mg | ORAL_TABLET | Freq: Every day | ORAL | Status: DC

## 2011-06-25 MED ORDER — HYDROCODONE-ACETAMINOPHEN 5-500 MG PO TABS: 1.0000 | ORAL_TABLET | Freq: Three times a day (TID) | ORAL | Status: DC | PRN

## 2011-06-25 NOTE — Telephone Encounter (Signed)
He called to get a refill on his vicodin. I refilled #30. Told him that means he only has enough for 1 a day. Suggested he see md & discuss his back & the pain. He agreed. Transferred to front for an appt

## 2011-06-27 ENCOUNTER — Telehealth: Payer: Self-pay | Admitting: *Deleted

## 2011-06-27 ENCOUNTER — Ambulatory Visit: Payer: Self-pay | Admitting: Infectious Diseases

## 2011-06-27 NOTE — Telephone Encounter (Signed)
He was seen at [facility]. Dr. Philipp Deputy is his pcp there

## 2011-07-06 ENCOUNTER — Telehealth: Payer: Self-pay | Admitting: Infectious Diseases

## 2011-07-06 NOTE — Telephone Encounter (Signed)
Tristan Pugh called about his Viagra.  Refill has already been requested.

## 2011-07-19 ENCOUNTER — Ambulatory Visit: Payer: Self-pay | Admitting: Infectious Diseases

## 2011-07-19 ENCOUNTER — Other Ambulatory Visit: Payer: Self-pay | Admitting: *Deleted

## 2011-07-19 NOTE — Telephone Encounter (Signed)
Pam called for it in December. We have not rec'd any meds yet. Assured pt that we would call him when it arrives

## 2011-07-23 ENCOUNTER — Other Ambulatory Visit: Payer: Self-pay | Admitting: *Deleted

## 2011-07-23 DIAGNOSIS — N529 Male erectile dysfunction, unspecified: Secondary | ICD-10-CM

## 2011-07-23 MED ORDER — SILDENAFIL CITRATE 25 MG PO TABS: 25.0000 mg | ORAL_TABLET | ORAL | Status: DC | PRN

## 2011-08-27 ENCOUNTER — Telehealth: Payer: Self-pay | Admitting: *Deleted

## 2011-08-27 ENCOUNTER — Other Ambulatory Visit: Payer: Self-pay | Admitting: *Deleted

## 2011-08-27 DIAGNOSIS — B20 Human immunodeficiency virus [HIV] disease: Secondary | ICD-10-CM

## 2011-08-27 MED ORDER — EMTRICITABINE-TENOFOVIR DF 200-300 MG PO TABS: 1.0000 | ORAL_TABLET | Freq: Every day | ORAL | Status: DC

## 2011-08-27 NOTE — Telephone Encounter (Signed)
Message left to call [facility] to make lab and MD appt.  Phone number given.

## 2011-10-29 ENCOUNTER — Other Ambulatory Visit: Payer: Self-pay | Admitting: Licensed Clinical Social Worker

## 2011-10-29 DIAGNOSIS — B20 Human immunodeficiency virus [HIV] disease: Secondary | ICD-10-CM

## 2011-10-29 MED ORDER — RITONAVIR 100 MG PO TABS: 100.0000 mg | ORAL_TABLET | Freq: Every day | ORAL | Status: DC

## 2012-01-26 IMAGING — CR DG CHEST 2V
2 series · 2 of 2 positions shown · non-contrast
Comparison: None available.

CLINICAL DATA: Sinus pressure.  Chest tightness and sore throat.
Weight loss.

CHEST - 2 VIEW

[w chest pa]
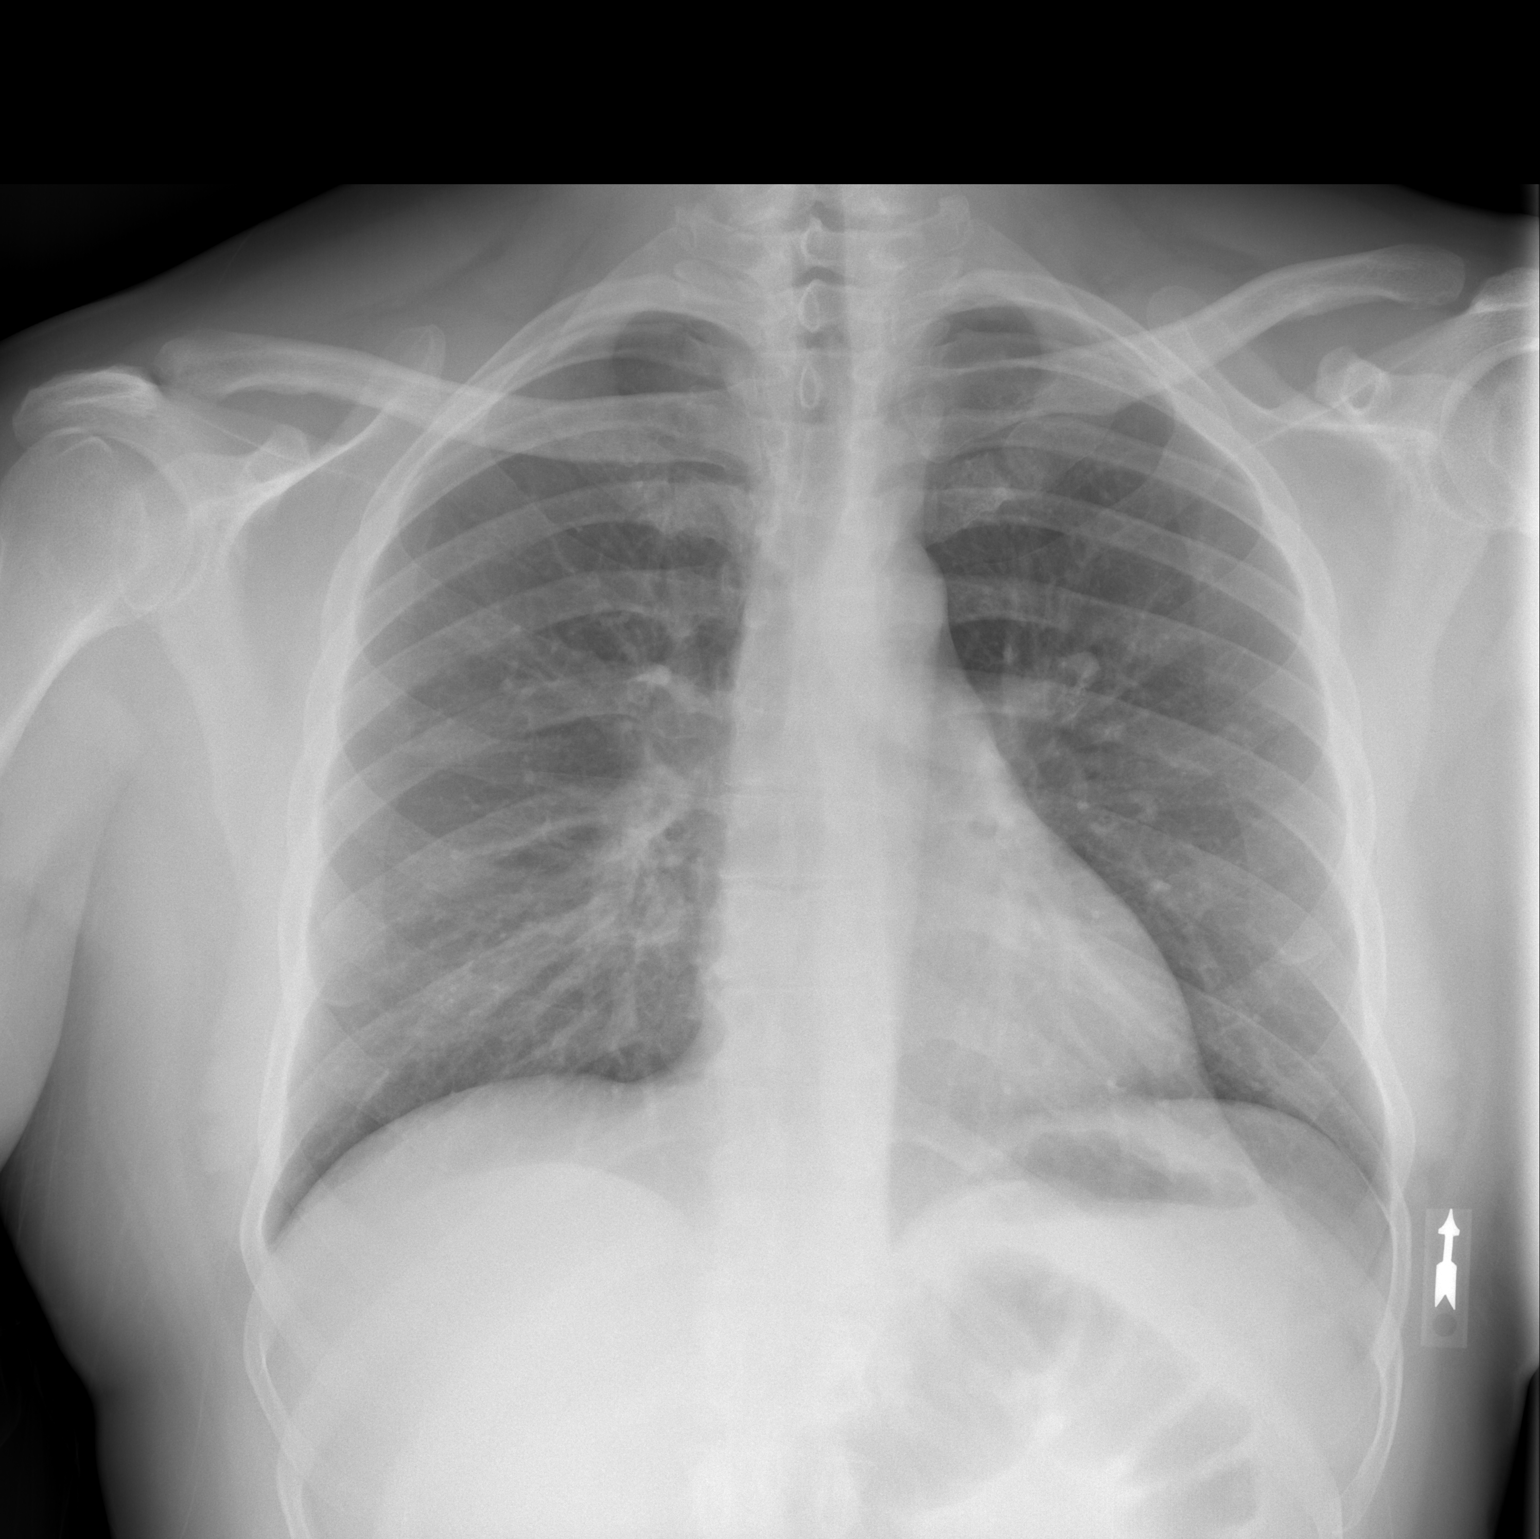

[w chest lat]
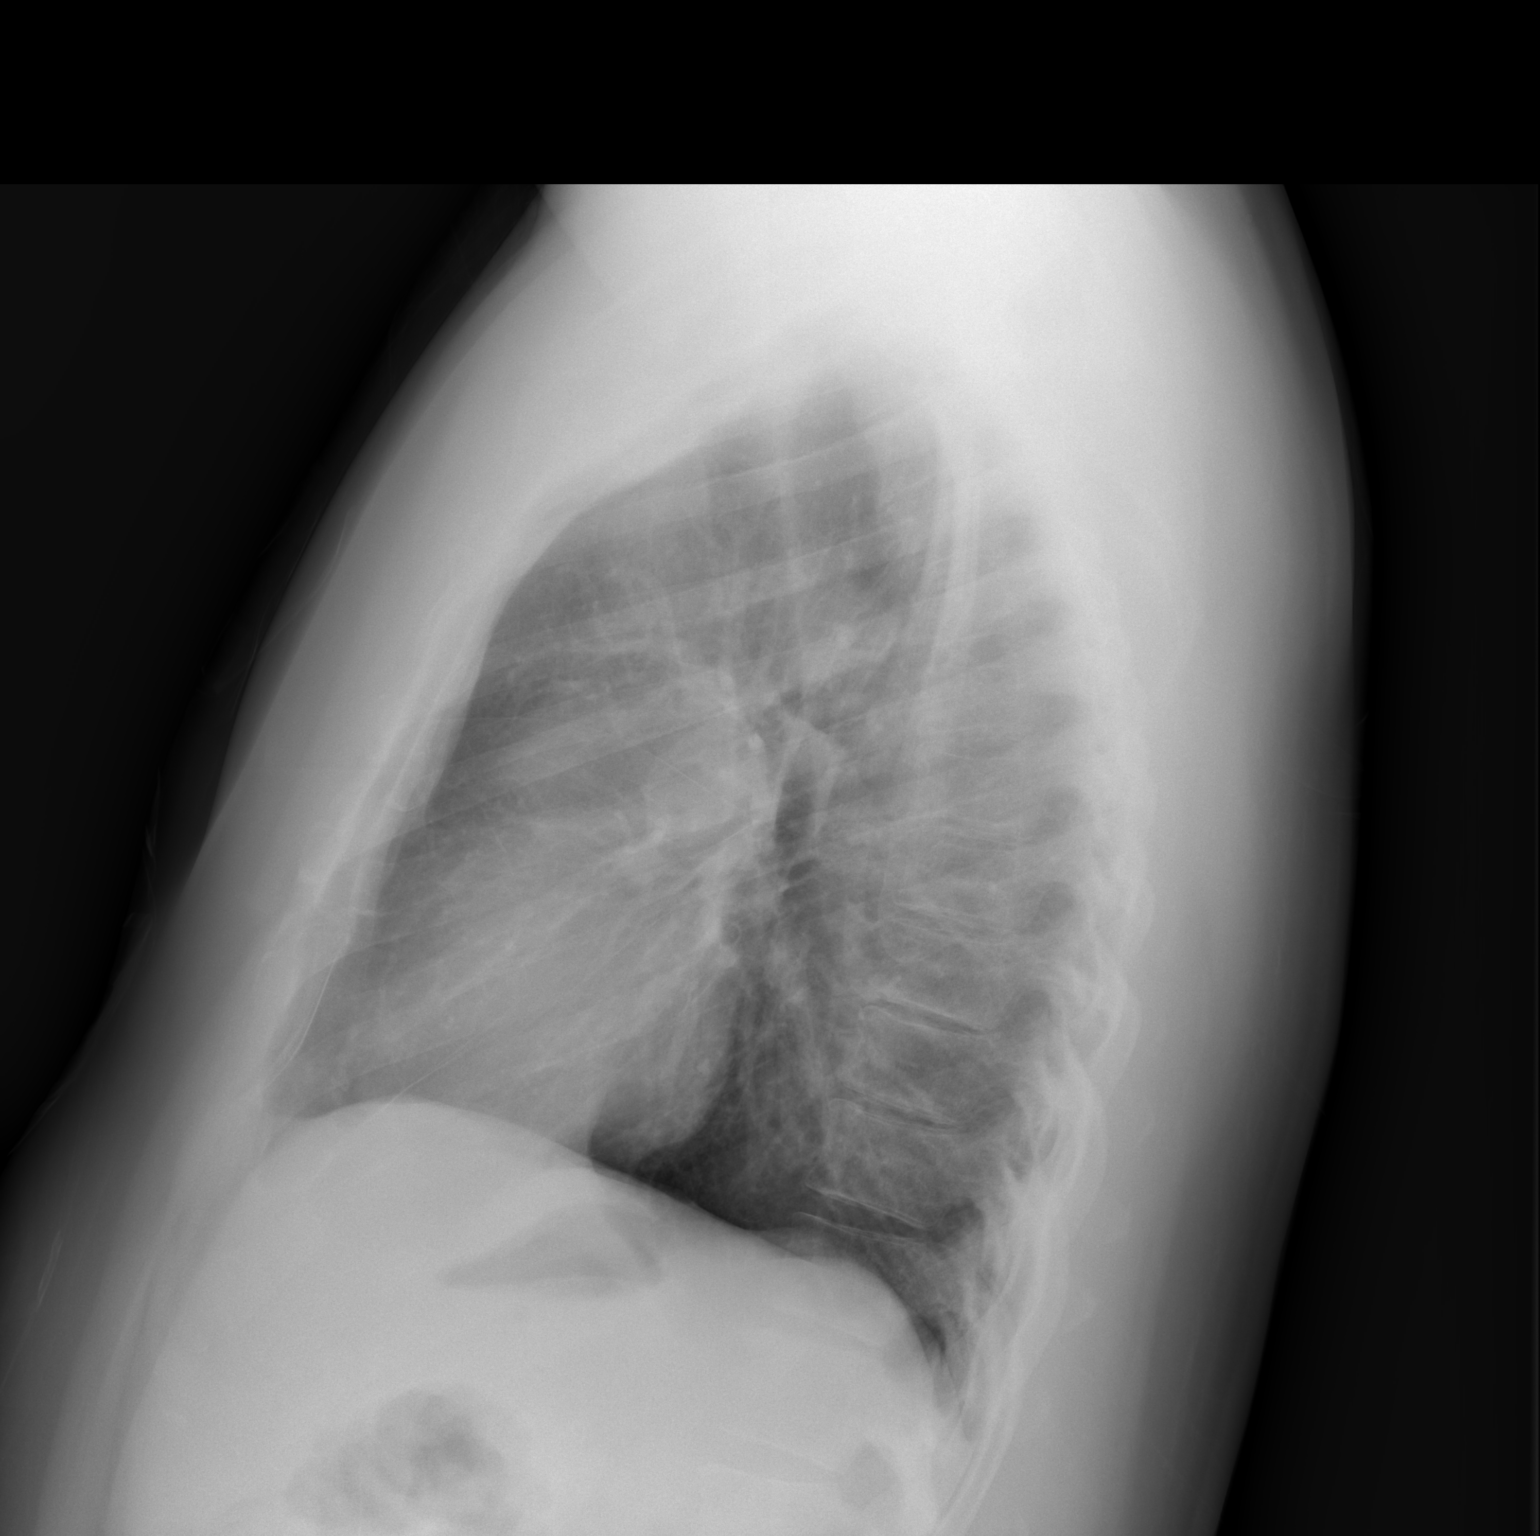

[2 of 2 positions shown; findings below may reference images not displayed]

FINDINGS: The cardiopericardial silhouette is within normal limits
for size.  The lungs are clear.  The visualized soft tissues and
bony thorax are unremarkable.
IMPRESSION: No acute cardiopulmonary disease.

## 2012-05-07 ENCOUNTER — Emergency Department (HOSPITAL_COMMUNITY)
Admission: EM | Admit: 2012-05-07 | Discharge: 2012-05-07 | Disposition: A | Discharge status: Home / Self Care | Payer: Medicaid Other | Source: Home / Self Care | Attending: Emergency Medicine | Admitting: Emergency Medicine

## 2012-05-07 ENCOUNTER — Encounter (HOSPITAL_COMMUNITY): Payer: Self-pay

## 2012-05-07 DIAGNOSIS — R0982 Postnasal drip: Secondary | ICD-10-CM

## 2012-05-07 DIAGNOSIS — R05 Cough: Secondary | ICD-10-CM

## 2012-05-07 MED ORDER — GUAIFENESIN-CODEINE 100-10 MG/5ML PO SYRP: 5.0000 mL | ORAL_SOLUTION | Freq: Three times a day (TID) | ORAL | Status: DC | PRN

## 2012-05-07 MED ORDER — FEXOFENADINE-PSEUDOEPHED ER 60-120 MG PO TB12: 1.0000 | ORAL_TABLET | Freq: Two times a day (BID) | ORAL | Status: DC

## 2012-05-07 NOTE — ED Provider Notes (Signed)
History     CSN: [id number]  Arrival date & time 05/07/12  4098   First MD Initiated Contact with Patient 05/07/12 [phone]      Chief Complaint  Patient presents with  . Cough    (Consider location/radiation/quality/duration/timing/severity/associated sxs/prior treatment) HPI Comments: Patient is currently reporting a cough for 2 weeks, afebrile and some minimal nocturnal postnasal drainage or drip sensation. At point of exam patient is asymptomatic denies any shortness of breath, no constitutional symptoms. NA minimally disturbing dry cough. (Nonproductive as per my history). He also reports that at night he feels a sensation of dripping behind his throat, and he feels that every October he developed similar symptoms and are usually related to allergies or weather changes. He is also reporting that his HIV medicines are working as they have told him that his viral load and CD4 counts are okay"..     Patient is a 46 y.o. male presenting with cough. The history is provided by the patient.  Cough This is a new problem. The current episode started more than 1 week ago. The problem has not changed since onset.The cough is non-productive. There has been no fever. Pertinent negatives include no chest pain, no chills, no sweats, no weight loss, no ear congestion, no headaches, no rhinorrhea, no sore throat, no myalgias, no shortness of breath, no wheezing and no eye redness. His past medical history does not include bronchitis, pneumonia, COPD, emphysema or asthma.    Past Medical History  Diagnosis Date  . CVA (cerebral infarction) June 2012     L cerebellar    History reviewed. No pertinent past surgical history.  Family History  Problem Relation Age of Onset  . Stroke Father     History  Substance Use Topics  . Smoking status: Never Smoker   . Smokeless tobacco: Never Used  . Alcohol Use: No      Review of Systems  Constitutional: Negative for chills and weight loss.  HENT:  Negative for sore throat and rhinorrhea.   Eyes: Negative for redness.  Respiratory: Positive for cough. Negative for shortness of breath and wheezing.   Cardiovascular: Negative for chest pain.  Musculoskeletal: Negative for myalgias.  Neurological: Negative for headaches.    Allergies  Dapsone and Sulfonamide derivatives  Home Medications   Current Outpatient Rx  Name Route Sig Dispense Refill  . ATAZANAVIR SULFATE 300 MG PO CAPS Oral Take 1 capsule (300 mg total) by mouth daily with breakfast. 30 capsule 6  . EMTRICITABINE-TENOFOVIR 200-300 MG PO TABS Oral Take 1 tablet by mouth daily. 30 tablet 4  . ENALAPRIL MALEATE 5 MG PO TABS Oral Take 5 mg by mouth daily.      Marland Kitchen GLIPIZIDE ER 10 MG PO TB24 Oral Take 10 mg by mouth daily.      Marland Kitchen GLUCOSE BLOOD VI STRP Other 1 each by Other route daily. Use as instructed     . HYDROCODONE-ACETAMINOPHEN 5-500 MG PO TABS Oral Take 1 tablet by mouth every 8 (eight) hours as needed for pain. 30 tablet 0  . INSULIN GLARGINE 100 UNIT/ML [place] SOLN Subcutaneous Inject 16 Units into the skin at bedtime. 10 mL 2  . METFORMIN HCL 1000 MG PO TABS Oral Take 1,000 mg by mouth 2 (two) times daily with a meal.      . NORVIR 100 MG PO TABS  TAKE 1 TABLET BY MOUTH DAILY 30 tablet 1  . PRAVASTATIN SODIUM 40 MG PO TABS Oral Take 40 mg by  mouth daily.      Marland Kitchen SILDENAFIL CITRATE 25 MG PO TABS Oral Take 1 tablet (25 mg total) by mouth as needed for erectile dysfunction. As directed 30 tablet 0    PAP received, Lot 3 [id number], exp. 04/15/2015.  #30  ...  . AZITHROMYCIN 500 MG PO TABS  TAKE 1 TABLET BY MOUTH DAILY 5 tablet 0  . FEXOFENADINE-PSEUDOEPHED ER 60-120 MG PO TB12 Oral Take 1 tablet by mouth every 12 (twelve) hours. 15 tablet 0  . GUAIFENESIN-CODEINE 100-10 MG/5ML PO SYRP Oral Take 5 mLs by mouth 3 (three) times daily as needed for cough. 120 mL 0  . INSULIN ASPART 100 UNIT/ML [place] SOLN Subcutaneous Inject 5 Units into the skin 3 (three) times daily before meals.      Marland Kitchen  RITONAVIR 100 MG PO TABS Oral Take 1 tablet (100 mg total) by mouth daily. 30 tablet 6    BP 137/86  Pulse 78  Temp 98.2 F (36.8 C) (Oral)  Resp 18  SpO2 100%  Physical Exam  ED Course  Procedures (including critical care time)  Labs Reviewed - No data to display No results found.   1. Cough   2. Postnasal drip       MDM  Reactive cough, allergenic versus environmentally induced. Patient was prescribed Tylenol with Codeine along with Allegra-D. Have been encouraged to return if no improvement is noted after 5 or 7 days or sooner if worsening symptoms. Patient agrees with treatment plan followup care as necessary. Although patient inquire about antibiotic usage we discuss that it's at this point very likely that this is infectious in character as patient denies any fevers or constitutional symptoms and is a predominant dry cough.     Jimmie Molly, MD 05/07/12 1251

## 2012-05-07 NOTE — ED Notes (Signed)
Patient states that dry cough that started productive approx 2 weeks ago, no other sx at this time but a dry irritating cough, used cough drops only helps temporarily

## 2012-05-21 ENCOUNTER — Encounter (HOSPITAL_COMMUNITY): Payer: Self-pay | Admitting: Emergency Medicine

## 2012-05-21 ENCOUNTER — Emergency Department (INDEPENDENT_AMBULATORY_CARE_PROVIDER_SITE_OTHER)
Admission: EM | Admit: 2012-05-21 | Discharge: 2012-05-21 | Disposition: A | Discharge status: Home / Self Care | Payer: Medicaid Other | Source: Home / Self Care | Attending: Emergency Medicine | Admitting: Emergency Medicine

## 2012-05-21 DIAGNOSIS — T50905A Adverse effect of unspecified drugs, medicaments and biological substances, initial encounter: Secondary | ICD-10-CM

## 2012-05-21 DIAGNOSIS — Z888 Allergy status to other drugs, medicaments and biological substances status: Secondary | ICD-10-CM

## 2012-05-21 HISTORY — DX: Immunodeficiency, unspecified: D84.9

## 2012-05-21 HISTORY — DX: Type 2 diabetes mellitus without complications: E11.9

## 2012-05-21 MED ORDER — METHYLPREDNISOLONE ACETATE 80 MG/ML IJ SUSP: 80.0000 mg | Freq: Once | INTRAMUSCULAR | Status: DC

## 2012-05-21 MED ORDER — METHYLPREDNISOLONE ACETATE 80 MG/ML IJ SUSP
INTRAMUSCULAR | Status: AC
  Filled 2012-05-21: qty 1

## 2012-05-21 MED ORDER — PREDNISONE 10 MG PO TABS: ORAL_TABLET | ORAL | Status: DC

## 2012-05-21 NOTE — ED Notes (Signed)
Rash; seen by MD only

## 2012-05-21 NOTE — ED Provider Notes (Addendum)
Chief Complaint  Patient presents with  . Rash    History of Present Illness:   Tristan Pugh is a 46 year old HIV-positive male who was started on Trilipex 2 weeks ago. About 3 or 4 days thereafter and he broke out in a rash that first appeared on his face and spread to his chest, abdomen, and thighs. It was mildly pruritic. He stopped taking the Trilipex 5 days ago, but the rash has persisted. He went to see his primary care doctor, Dr. Mayford Knife on [address] who thought this might be allergic reaction. She was going to give him a cream but never did get around to calling it in. He denies any difficulty breathing or wheezing. He's had no chest pain. No swelling of his lips, throat, or tongue. He also has diabetes, chronic lower back pain, and hyperlipidemia. He is allergic to sulfa drugs.  Review of Systems:  Other than noted above, the patient denies any of the following symptoms: Systemic:  No fever, chills, sweats, weight loss, or fatigue. ENT:  No nasal congestion, rhinorrhea, sore throat, swelling of lips, tongue or throat. Resp:  No cough, wheezing, or shortness of breath. Skin:  No rash, itching, nodules, or suspicious lesions.  PMFSH:  Past medical history, family history, social history, meds, and allergies were reviewed.  Physical Exam:   Vital signs:  BP 128/87  Pulse 88  Temp 98.4 F (36.9 C) (Oral)  Resp 18  SpO2 98% Gen:  Alert, oriented, in no distress. ENT:  Pharynx clear, no intraoral lesions, moist mucous membranes. Lungs:  Clear to auscultation. Skin:  He has numerous oval flat topped papules on his face, chest, abdomen, and a few on his thighs and one on his left upper arm. He doesn't have any on his back, palms, or soles. There are no intraoral lesions.    Course in Urgent Care Center:   He was given Depo-Medrol 80 mg IM. He tolerated this well without any immediate side effects.  Assessment:  The encounter diagnosis was Allergic reaction due to correct medicinal  substance properly administered.  This appears to be an allergic reaction to medication. It may be the Trilipex. He's on numerous other medications.  I suggested that if he gets no better within 2 weeks that he ought to see a dermatologist.  Plan:   1.  The following meds were prescribed:   New Prescriptions   PREDNISONE (DELTASONE) 10 MG TABLET    Take 4 tabs daily for 4 days, 3 tabs daily for 4 days, 2 tabs daily for 4 days, then 1 tab daily for 4 days.   2.  The patient was instructed in symptomatic care and handouts were given. He was warned that the prednisone might make his blood sugar bump up a little bit and to keep a close watch on this. 3.  The patient was told to return if becoming worse in any way, if no better in 3 or 4 days, and given some red flag symptoms that would indicate earlier return.     Reuben Likes, MD 05/21/12 1738  Reuben Likes, MD 05/21/12 1739

## 2012-05-29 NOTE — ED Notes (Signed)
Pt called and reported that he needed referrals to dr. Dorita Sciara office. Dr. Dorita Sciara office called and informed that they were not taking any more Medicaid pt at this time. Call returned to pt and informed that they were not taking any new pt. Pt to call other dermatology offices to see if they are expecting new patients.

## 2012-06-04 ENCOUNTER — Emergency Department (HOSPITAL_COMMUNITY)
Admission: EM | Admit: 2012-06-04 | Discharge: 2012-06-04 | Disposition: A | Discharge status: Home / Self Care | Payer: Medicaid Other | Attending: Emergency Medicine | Admitting: Emergency Medicine

## 2012-06-04 ENCOUNTER — Encounter (HOSPITAL_COMMUNITY): Payer: Self-pay

## 2012-06-04 DIAGNOSIS — Z8659 Personal history of other mental and behavioral disorders: Secondary | ICD-10-CM | POA: Insufficient documentation

## 2012-06-04 DIAGNOSIS — E1142 Type 2 diabetes mellitus with diabetic polyneuropathy: Secondary | ICD-10-CM | POA: Insufficient documentation

## 2012-06-04 DIAGNOSIS — D8989 Other specified disorders involving the immune mechanism, not elsewhere classified: Secondary | ICD-10-CM | POA: Insufficient documentation

## 2012-06-04 DIAGNOSIS — Z79899 Other long term (current) drug therapy: Secondary | ICD-10-CM | POA: Insufficient documentation

## 2012-06-04 DIAGNOSIS — R739 Hyperglycemia, unspecified: Secondary | ICD-10-CM

## 2012-06-04 DIAGNOSIS — I6990 Unspecified sequelae of unspecified cerebrovascular disease: Secondary | ICD-10-CM | POA: Insufficient documentation

## 2012-06-04 DIAGNOSIS — Z794 Long term (current) use of insulin: Secondary | ICD-10-CM | POA: Insufficient documentation

## 2012-06-04 DIAGNOSIS — E1149 Type 2 diabetes mellitus with other diabetic neurological complication: Secondary | ICD-10-CM | POA: Insufficient documentation

## 2012-06-04 DIAGNOSIS — R7309 Other abnormal glucose: Secondary | ICD-10-CM | POA: Insufficient documentation

## 2012-06-04 DIAGNOSIS — M549 Dorsalgia, unspecified: Secondary | ICD-10-CM | POA: Insufficient documentation

## 2012-06-04 HISTORY — DX: Other amnesia: R41.3

## 2012-06-04 HISTORY — DX: Polyneuropathy, unspecified: G62.9

## 2012-06-04 LAB — CBC WITH DIFFERENTIAL/PLATELET
Eosinophils Absolute: 0 10*3/uL (ref 0.0–0.7)
Eosinophils Relative: 0 % (ref 0–5)
HCT: 38.4 % — ABNORMAL LOW (ref 39.0–52.0)
Lymphocytes Relative: 27 % (ref 12–46)
Lymphs Abs: 1.9 10*3/uL (ref 0.7–4.0)
MCH: 27.9 pg (ref 26.0–34.0)
MCV: 82.9 fL (ref 78.0–100.0)
Monocytes Absolute: 0.7 10*3/uL (ref 0.1–1.0)
Platelets: 305 10*3/uL (ref 150–400)
RBC: 4.63 MIL/uL (ref 4.22–5.81)

## 2012-06-04 LAB — POTASSIUM: Potassium: 5 mEq/L (ref 3.5–5.1)

## 2012-06-04 LAB — URINALYSIS, ROUTINE W REFLEX MICROSCOPIC
Glucose, UA: >1000 mg/dL — AB
Hgb urine dipstick: NEGATIVE
Protein, ur: 30 mg/dL — AB
Urobilinogen, UA: 1 mg/dL (ref 0.0–1.0)

## 2012-06-04 LAB — BASIC METABOLIC PANEL
BUN: 39 mg/dL — ABNORMAL HIGH (ref 6–23)
CO2: 22 mEq/L (ref 19–32)
Calcium: 10 mg/dL (ref 8.4–10.5)
Creatinine, Ser: 1.79 mg/dL — ABNORMAL HIGH (ref 0.50–1.35)
GFR calc non Af Amer: 43 mL/min — ABNORMAL LOW (ref >90)
Glucose, Bld: 287 mg/dL — ABNORMAL HIGH (ref 70–99)
Sodium: 127 mEq/L — ABNORMAL LOW (ref 135–145)

## 2012-06-04 LAB — URINE MICROSCOPIC-ADD ON

## 2012-06-04 LAB — GLUCOSE, CAPILLARY: Glucose-Capillary: 167 mg/dL — ABNORMAL HIGH (ref 70–99)

## 2012-06-04 MED ORDER — SODIUM CHLORIDE 0.9 % IV SOLN
INTRAVENOUS | Status: DC
Start: 2012-06-04 — End: 2012-06-04

## 2012-06-04 MED ORDER — SODIUM CHLORIDE 0.9 % IV BOLUS (SEPSIS)
2000.0000 mL | Freq: Once | INTRAVENOUS | Status: AC
  Administered 2012-06-04 (×2): 1000 mL via INTRAVENOUS

## 2012-06-04 NOTE — ED Notes (Signed)
Patient c/o hyperglycemia and low back pain. Patient also c/o itching from taking Cipro.

## 2012-06-04 NOTE — ED Provider Notes (Signed)
History     CSN: [id number]  Arrival date & time 06/04/12  0906   First MD Initiated Contact with Patient 06/04/12 1044      Chief Complaint  Patient presents with  . Hyperglycemia  . Back Pain    (Consider location/radiation/quality/duration/timing/severity/associated sxs/prior treatment) HPI Comments: Tristan Pugh is a 46 y.o. Male who is here because, he felt too weak to work today. He is doing. Community service. He wants a note to say he has to do less strenuous activity in the community service. He was seen several weeks ago, and treated for a facial rash, felt to be secondary to antiretrovirals. He has mild, dizziness on standing. He denies paresthesias, presyncope, since 8 chest pain, shortness of nausea, or vomiting. He has ongoing pruritus since the rash started. CT completed. The prednisone prescription, he is not taking anything for the rash or itching. He is taking all of his medicines as prescribed. There are no other modifying factors.  Patient is a 46 y.o. male presenting with back pain. The history is provided by the patient.  Back Pain     Past Medical History  Diagnosis Date  . 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 Immune deficiency disorder   . Diabetes mellitus without complication   . Neuropathy   . Short-term memory loss     Past Surgical History  Procedure Date  . Foot surgery     Family History  Problem Relation Age of Onset  . Stroke Father     History  Substance Use Topics  . Smoking status: Never Smoker   . Smokeless tobacco: Never Used  . Alcohol Use: Yes     Comment: occasional      Review of Systems  Musculoskeletal: Positive for back pain.  All other systems reviewed and are negative.    Allergies  Dapsone and Sulfonamide derivatives  Home Medications   Current Outpatient Rx  Name  Route  Sig  Dispense  Refill  . ATAZANAVIR SULFATE 300 MG PO CAPS   Oral   Take 1 capsule (300 mg total) by mouth  daily with breakfast.   30 capsule   6   . CIPROFLOXACIN HCL 500 MG PO TABS   Oral   Take 500 mg by mouth 2 (two) times daily. Pt started on 05-23-12 finished on 05-29-12         . EMTRICITABINE-TENOFOVIR 200-300 MG PO TABS   Oral   Take 1 tablet by mouth daily.   30 tablet   4   . ENALAPRIL MALEATE 5 MG PO TABS   Oral   Take 5 mg by mouth daily.           Marland Kitchen GLIPIZIDE ER 10 MG PO TB24   Oral   Take 10 mg by mouth daily.           Marland Kitchen GLUCOSE BLOOD VI STRP   Other   1 each by Other route daily. Use as instructed          . HYDROCODONE-ACETAMINOPHEN 5-500 MG PO TABS   Oral   Take 1 tablet by mouth every 8 (eight) hours as needed for pain.   30 tablet   0   . INSULIN GLARGINE 100 UNIT/ML [place] SOLN   Subcutaneous   Inject 16 Units into the skin at bedtime.   10 mL   2   . MELOXICAM 15 MG PO TABS   Oral   Take  15 mg by mouth daily.         Marland Kitchen METFORMIN HCL 1000 MG PO TABS   Oral   Take 1,000 mg by mouth 2 (two) times daily with a meal.           . NORVIR 100 MG PO TABS      TAKE 1 TABLET BY MOUTH DAILY   30 tablet   1   . PRAVASTATIN SODIUM 40 MG PO TABS   Oral   Take 40 mg by mouth daily.           Marland Kitchen PREDNISONE 10 MG PO TABS   Oral   Take 20 mg by mouth 2 (two) times daily. Take 4 tabs daily for 4 days, 3 tabs daily for 4 days, 2 tabs daily for 4 days, then 1 tab daily for 4 days.         Marland Kitchen RITONAVIR 100 MG PO TABS   Oral   Take 1 tablet (100 mg total) by mouth daily.   30 tablet   6   . SILDENAFIL CITRATE 25 MG PO TABS   Oral   Take 1 tablet (25 mg total) by mouth as needed for erectile dysfunction. As directed   30 tablet   0     PAP received, Lot 3 [id number], exp. 04/15/2015.  #30  ...     BP 126/78  Pulse 69  Temp 98.5 F (36.9 C) (Oral)  Resp 15  SpO2 100%  Physical Exam  Nursing note and vitals reviewed. Constitutional: He is oriented to person, place, and time. He appears well-developed and well-nourished.  HENT:  Head:  Normocephalic and atraumatic.  Right Ear: External ear normal.  Left Ear: External ear normal.  Eyes: Conjunctivae normal and EOM are normal. Pupils are equal, round, and reactive to light.  Neck: Normal range of motion and phonation normal. Neck supple.  Cardiovascular: Normal rate, regular rhythm, normal heart sounds and intact distal pulses.   Pulmonary/Chest: Effort normal and breath sounds normal. He exhibits no bony tenderness.  Abdominal: Soft. Normal appearance. There is no tenderness.  Musculoskeletal: Normal range of motion. He exhibits no edema.  Neurological: He is alert and oriented to person, place, and time. He has normal strength. No cranial nerve deficit or sensory deficit. He exhibits normal muscle tone. Coordination normal.  Skin: Skin is warm, dry and intact.       Indistinct papular rash on face, and trunk. It appears subacute, and healing. The papules are several millimeters in size and skin color. There are no areas of vesicles, petechiae, confluence, or fluctuantance  Psychiatric: He has a normal mood and affect. His behavior is normal. Judgment and thought content normal.    ED Course  Procedures (including critical care time)  Emergency department treatment: IV fluid, 2 L bolus.      Labs Reviewed  GLUCOSE, CAPILLARY - Abnormal; Notable for the following:    Glucose-Capillary 320 (*)     All other components within normal limits  CBC WITH DIFFERENTIAL - Abnormal; Notable for the following:    Hemoglobin 12.9 (*)     HCT 38.4 (*)     All other components within normal limits  BASIC METABOLIC PANEL - Abnormal; Notable for the following:    Sodium 127 (*)     Potassium 5.5 (*)     Chloride 94 (*)     Glucose, Bld 287 (*)     BUN 39 (*)     Creatinine,  Ser 1.79 (*)     GFR calc non Af Amer 43 (*)     GFR calc Af Amer 50 (*)     All other components within normal limits  URINALYSIS, ROUTINE W REFLEX MICROSCOPIC - Abnormal; Notable for the following:     Color, Urine AMBER (*)  BIOCHEMICALS MAY BE AFFECTED BY COLOR   APPearance CLOUDY (*)     Specific Gravity, Urine 1.035 (*)     Glucose, UA >1000 (*)     Bilirubin Urine SMALL (*)     Ketones, ur TRACE (*)     Protein, ur 30 (*)     All other components within normal limits  URINE MICROSCOPIC-ADD ON - Abnormal; Notable for the following:    Bacteria, UA FEW (*)     Casts HYALINE CASTS (*)     All other components within normal limits  GLUCOSE, CAPILLARY - Abnormal; Notable for the following:    Glucose-Capillary 167 (*)     All other components within normal limits  POTASSIUM   No results found. Nursing notes, applicable records and vitals reviewed.  Radiologic Images/Reports reviewed.   1. Hyperglycemia       MDM  Hyperglycemia, likely secondary to recent use of prednisone. Doubt metabolic instability, serious bacterial infection or impending vascular collapse; the patient is stable for discharge.   Plan: Home Medications- usual; Home Treatments- rest, fluids; Recommended follow up- PCP 3-5 days      Flint Melter, MD 06/04/12 2026

## 2012-06-06 ENCOUNTER — Emergency Department (INDEPENDENT_AMBULATORY_CARE_PROVIDER_SITE_OTHER)
Admission: EM | Admit: 2012-06-06 | Discharge: 2012-06-06 | Disposition: A | Discharge status: Home / Self Care | Payer: Medicaid Other | Source: Home / Self Care

## 2012-06-06 ENCOUNTER — Encounter (HOSPITAL_COMMUNITY): Payer: Self-pay | Admitting: Emergency Medicine

## 2012-06-06 DIAGNOSIS — R21 Rash and other nonspecific skin eruption: Secondary | ICD-10-CM

## 2012-06-06 MED ORDER — DIPHENHYDRAMINE HCL 25 MG PO TABS: 25.0000 mg | ORAL_TABLET | Freq: Four times a day (QID) | ORAL | Status: DC | PRN

## 2012-06-06 MED ORDER — RANITIDINE HCL 150 MG PO TABS: 150.0000 mg | ORAL_TABLET | Freq: Two times a day (BID) | ORAL | Status: DC

## 2012-06-06 NOTE — ED Notes (Signed)
Rash all over body.  Patient would like a referral for derma

## 2012-06-06 NOTE — ED Provider Notes (Signed)
Patient Demographics  Tristan Pugh, is a 46 y.o. male  ZOX:[id number]  WJX:[id number]  DOB - 1965/08/09  No chief complaint on file.       Subjective:   Tristan Pugh today has, No headache, No chest pain, No abdominal pain - No Nausea, No new weakness tingling or numbness, No Cough - SOB. Some itching on the face and torso.  Objective:    Filed Vitals:   06/06/12 1119  BP: 116/83  Pulse: 85  Temp: 98.1 F (36.7 C)  TempSrc: Oral  Resp: 18  SpO2: 100%     Exam  Awake Alert, Oriented X 3, No new F.N deficits, Normal affect [place].AT,PERRAL Supple Neck,No JVD, No cervical lymphadenopathy appriciated.  Symmetrical Chest wall movement, Good air movement bilaterally, CTAB RRR,No Gallops,Rubs or new Murmurs, No Parasternal Heave +ve B.Sounds, Abd Soft, Non tender, No organomegaly appriciated, No rebound - guarding or rigidity. No Cyanosis, Clubbing or edema, No new Rash or bruise old right macular rash on the face    Data Review   CBC  Lab 06/04/12 1130  WBC 6.9  HGB 12.9*  HCT 38.4*  PLT 305  MCV 82.9  MCH 27.9  MCHC 33.6  RDW 12.8  LYMPHSABS 1.9  MONOABS 0.7  EOSABS 0.0  BASOSABS 0.0  BANDABS --    Chemistries    Lab 06/04/12 1245 06/04/12 1130  NA -- 127*  K 5.0 5.5*  CL -- 94*  CO2 -- 22  GLUCOSE -- 287*  BUN -- 39*  CREATININE -- 1.79*  CALCIUM -- 10.0  MG -- --  AST -- --  ALT -- --  ALKPHOS -- --  BILITOT -- --   ------------------------------------------------------------------------------------------------------------------ No results found for this basename: HGBA1C:2 in the last 72 hours ------------------------------------------------------------------------------------------------------------------ No results found for this basename: CHOL:2,HDL:2,LDLCALC:2,TRIG:2,CHOLHDL:2,LDLDIRECT:2 in the last 72 hours ------------------------------------------------------------------------------------------------------------------ No  results found for this basename: TSH,T4TOTAL,FREET3,T3FREE,THYROIDAB in the last 72 hours ------------------------------------------------------------------------------------------------------------------ No results found for this basename: VITAMINB12:2,FOLATE:2,FERRITIN:2,TIBC:2,IRON:2,RETICCTPCT:2 in the last 72 hours  Coagulation profile  No results found for this basename: INR:5,PROTIME:5 in the last 168 hours  Urinalysis    Component Value Date/Time   COLORURINE AMBER* 06/04/2012 1150   APPEARANCEUR CLOUDY* 06/04/2012 1150   LABSPEC 1.035* 06/04/2012 1150   PHURINE 5.0 06/04/2012 1150   GLUCOSEU >1000* 06/04/2012 1150   HGBUR NEGATIVE 06/04/2012 1150   BILIRUBINUR SMALL* 06/04/2012 1150   KETONESUR TRACE* 06/04/2012 1150   PROTEINUR 30* 06/04/2012 1150   UROBILINOGEN 1.0 06/04/2012 1150   NITRITE NEGATIVE 06/04/2012 1150   LEUKOCYTESUR NEGATIVE 06/04/2012 1150     Prior to Admission medications   Medication Sig Start Date End Date Taking? Authorizing Provider  atazanavir (REYATAZ) 300 MG capsule Take 1 capsule (300 mg total) by mouth daily with breakfast. 06/25/11   Randall Hiss, MD  ciprofloxacin (CIPRO) 500 MG tablet Take 500 mg by mouth 2 (two) times daily. Pt started on 05-23-12 finished on 05-29-12    Historical Provider, MD  diphenhydrAMINE (BENADRYL) 25 MG tablet Take 1 tablet (25 mg total) by mouth every 6 (six) hours as needed for itching. 06/06/12   Leroy Sea, MD  emtricitabine-tenofovir (TRUVADA) 200-300 MG per tablet Take 1 tablet by mouth daily. 08/27/11   Randall Hiss, MD  enalapril (VASOTEC) 5 MG tablet Take 5 mg by mouth daily.      Historical Provider, MD  glipiZIDE (GLUCOTROL XL) 10 MG 24 hr tablet Take 10 mg by mouth daily.  Historical Provider, MD  glucose blood (ACCU-CHEK AVIVA) test strip 1 each by Other route daily. Use as instructed     Historical Provider, MD  HYDROcodone-acetaminophen (VICODIN) 5-500 MG per tablet Take 1 tablet  by mouth every 8 (eight) hours as needed for pain. 06/25/11   Ginnie Smart, MD  insulin glargine (LANTUS) 100 UNIT/ML injection Inject 16 Units into the skin at bedtime. 02/05/11   Ginnie Smart, MD  metFORMIN (GLUCOPHAGE) 1000 MG tablet Take 1,000 mg by mouth 2 (two) times daily with a meal.      Historical Provider, MD  NORVIR 100 MG TABS TAKE 1 TABLET BY MOUTH DAILY 04/21/11   Randall Hiss, MD  pravastatin (PRAVACHOL) 40 MG tablet Take 40 mg by mouth daily.      Historical Provider, MD  ranitidine (ZANTAC) 150 MG tablet Take 1 tablet (150 mg total) by mouth 2 (two) times daily. 06/06/12   Leroy Sea, MD  ritonavir (NORVIR) 100 MG TABS Take 1 tablet (100 mg total) by mouth daily. 10/29/11   Randall Hiss, MD  sildenafil (VIAGRA) 25 MG tablet Take 1 tablet (25 mg total) by mouth as needed for erectile dysfunction. As directed 07/23/11   Ginnie Smart, MD     Assessment & Plan   1. Itching mostly in the face and his torso area, brought on by prednisone which was given for a macular rash which started 2 weeks ago with Trilipex, denies any throat itching or swelling, no new rash noted, old macular rash on the face, patient does have some itching from prednisone, have advised him to stop prednisone immediately, Benadryl and Zantac will be prescribed, topical calamine lotion which he already has for symptomatic relief, effort to outpatient dermatology, also advised to see his HIV physician Dr. Ninetta Lights within a week.  He has been advised that if his itching gets worse, if he breaks new rash, or he has any tongue or throat discomfort to present to the ER.   Leroy Sea M.D on 06/06/2012 at 11:42 AM  Leroy Sea, MD 06/06/12 1144

## 2012-06-10 ENCOUNTER — Other Ambulatory Visit: Payer: Self-pay | Admitting: Internal Medicine

## 2012-06-10 ENCOUNTER — Ambulatory Visit (INDEPENDENT_AMBULATORY_CARE_PROVIDER_SITE_OTHER): Payer: Medicaid Other | Admitting: Family Medicine

## 2012-06-10 ENCOUNTER — Encounter: Payer: Self-pay | Admitting: Family Medicine

## 2012-06-10 ENCOUNTER — Other Ambulatory Visit: Payer: Medicaid Other

## 2012-06-10 VITALS — BP 116/64 | HR 77 | Temp 98.2°F | Ht 69.6 in | Wt 202.0 lb

## 2012-06-10 DIAGNOSIS — E785 Hyperlipidemia, unspecified: Secondary | ICD-10-CM

## 2012-06-10 DIAGNOSIS — I10 Essential (primary) hypertension: Secondary | ICD-10-CM

## 2012-06-10 DIAGNOSIS — R21 Rash and other nonspecific skin eruption: Secondary | ICD-10-CM

## 2012-06-10 DIAGNOSIS — E119 Type 2 diabetes mellitus without complications: Secondary | ICD-10-CM

## 2012-06-10 DIAGNOSIS — B20 Human immunodeficiency virus [HIV] disease: Secondary | ICD-10-CM

## 2012-06-10 LAB — POCT GLYCOSYLATED HEMOGLOBIN (HGB A1C): Hemoglobin A1C: 10.5

## 2012-06-10 MED ORDER — INSULIN GLARGINE 100 UNIT/ML ~~LOC~~ SOLN: 18.0000 [IU] | Freq: Every day | SUBCUTANEOUS | Status: DC

## 2012-06-10 NOTE — Progress Notes (Signed)
Subjective:     Patient ID: Tristan Pugh, male   DOB: 1966/02/14, 46 y.o.   MRN: [id number]  HPI Mr. Archey is a former [facility] patient who presents to the clinic today to establish care.  1) HTN - Stable on Lisinopril  2) Hyperlipidemia - Currently on Pravastatin 40 mg. - Last lipid panel 01/2011 - LDL 172, Total Cholesterol 244 - Was recently started on trilipix.  This was discontinued due to allergic reaction.  3) DM-2 - Currently on Amaryl 4 mg daily, Metformin 1000 mg BID, and Lantus 16 U QHS - Fasting CBG's 130's - 200's.  Patient typically checks blood sugar once a day (morning or night) - Last A1C (12/2010) - 12.4  4) HIV - Managed by Dr. Ninetta Lights, ID.  Patient to follow up soon. - Last CD4 count 570, 01/2011  5) Stroke - On daily ASA 81 mg  PMH reviewed.  Family history also reviewed and notable for HTN in Mother and sister and stroke in father  Review of Systems General: denies fever, chills, weight loss. Cardiac: denies chest pain, SOB. GI: denies nausea, vomiting, diarrhea.  Reports good appetite. Endocrine: denies polyuria, polydipsia.    Objective:   Physical Exam Filed Vitals:   06/10/12 1407  BP: 116/64  Pulse: 77  Temp: 98.2 F (36.8 C)   General appearance: well appearing, NAD Neck: supple. No adenopathy present. Lungs: CTAB. No rales, rhonchi, or wheeze. CV: RRR, no murmurs, rubs, or gallops. Abdomen: Soft, non-tender, nondistended. No organomegaly appreciated. Extremities: No cyanosis clubbing or edema. Skin: erythematous, macular rash noted on face. Psych: normal mood and affect.      Assessment:        Plan:

## 2012-06-10 NOTE — Assessment & Plan Note (Signed)
Patient will continue benadryl and hydrocortisone as needed.  Was not interested in restarting Corticosteroids.  Patient requesting derm referral.   Will order derm referral as rash continues to be persistent.

## 2012-06-10 NOTE — Assessment & Plan Note (Signed)
At goal of <130/80.  Will continue ACEI.

## 2012-06-10 NOTE — Patient Instructions (Addendum)
It was nice meeting you today.  I will call you with the results of your blood work.  Be sure to follow up with Dr. Lowella Bandy.  I will see you back in 3 months for follow up regarding diabetes.  I am going to increase your lantus to 18 U at night.  Also, be sure to check your blood sugar daily and keep a log.  You can continue to take Benadryl for any associated itching that you have.  We will be in contact in regards to your referral.

## 2012-06-10 NOTE — Assessment & Plan Note (Signed)
Continue Pravastatin 40 mg.  Patient to return for fasting lipid panel.

## 2012-06-10 NOTE — Assessment & Plan Note (Signed)
A1C today 10.5.  Increasing Lantus to 18 U QHS.  Instructed patient to keep a log of blood sugars and to bring them to next visit.

## 2012-06-10 NOTE — Assessment & Plan Note (Signed)
Managed by Dr. Ninetta Lights.  Patient to follow up soon.  Last CD4 count 570.  Compliant with medications.  No reported side effects.

## 2012-06-11 LAB — COMPREHENSIVE METABOLIC PANEL
ALT: 151 U/L — ABNORMAL HIGH (ref 0–53)
Albumin: 4 g/dL (ref 3.5–5.2)
Alkaline Phosphatase: 358 U/L — ABNORMAL HIGH (ref 39–117)
BUN: 27 mg/dL — ABNORMAL HIGH (ref 6–23)
CO2: 21 mEq/L (ref 19–32)
CO2: 22 mEq/L (ref 19–32)
Calcium: 9.9 mg/dL (ref 8.4–10.5)
Creat: 1.64 mg/dL — ABNORMAL HIGH (ref 0.50–1.35)
Glucose, Bld: 204 mg/dL — ABNORMAL HIGH (ref 70–99)
Potassium: 5.4 mEq/L — ABNORMAL HIGH (ref 3.5–5.3)
Sodium: 135 mEq/L (ref 135–145)
Sodium: 134 mEq/L — ABNORMAL LOW (ref 135–145)
Total Bilirubin: 3.7 mg/dL — ABNORMAL HIGH (ref 0.3–1.2)
Total Protein: 7.9 g/dL (ref 6.0–8.3)
Total Protein: 8.3 g/dL (ref 6.0–8.3)

## 2012-06-11 LAB — CBC WITH DIFFERENTIAL/PLATELET
HCT: 38.3 % — ABNORMAL LOW (ref 39.0–52.0)
Hemoglobin: 12.4 g/dL — ABNORMAL LOW (ref 13.0–17.0)
Lymphs Abs: 1.7 10*3/uL (ref 0.7–4.0)
MCH: 27.9 pg (ref 26.0–34.0)
MCHC: 32.4 g/dL (ref 30.0–36.0)
Monocytes Absolute: 0.4 10*3/uL (ref 0.1–1.0)
Monocytes Relative: 7 % (ref 3–12)
Neutro Abs: 3.5 10*3/uL (ref 1.7–7.7)
Neutrophils Relative %: 62 % (ref 43–77)
RBC: 4.44 MIL/uL (ref 4.22–5.81)

## 2012-06-11 LAB — T-HELPER CELL (CD4) - (RCID CLINIC ONLY)
CD4 % Helper T Cell: 27 % — ABNORMAL LOW (ref 33–55)
CD4 T Cell Abs: 370 uL — ABNORMAL LOW (ref 400–2700)

## 2012-06-13 LAB — HIV-1 RNA QUANT-NO REFLEX-BLD
HIV 1 RNA Quant: 21 copies/mL (ref <20)
HIV-1 RNA Quant, Log: 1.32 {Log} — ABNORMAL HIGH (ref <1.30)

## 2012-06-15 ENCOUNTER — Telehealth: Payer: Self-pay | Admitting: Family Medicine

## 2012-06-15 NOTE — Telephone Encounter (Signed)
Called patient.  No answer.  I left message stating that he should call Monday for appointment as his labs were markedly abnormal.

## 2012-06-15 NOTE — Telephone Encounter (Signed)
Spoke with Tristan Pugh.  Briefly reviewed his lab work and told him to call Monday morning to schedule an appointment this week (as soon as possible). He understood and will be calling.

## 2012-06-16 ENCOUNTER — Telehealth: Payer: Self-pay | Admitting: *Deleted

## 2012-06-16 NOTE — Telephone Encounter (Signed)
Message copied by Deno Etienne on Mon Jun 16, 2012  9:15 AM ------      Message from: Tommie Sams      Created: Sun Jun 15, 2012 10:15 AM      Regarding: Tristan Pugh       I tried to call patient but was not able to reach him.  I did leave a message.  Tristan Pugh needs to be seen this week due to markedly abnormal lab values.             Thanks            [employer]

## 2012-06-16 NOTE — Telephone Encounter (Signed)
Called pt and scheduled appt for 12/3 @ 215 PM per Dr. Adriana Simas.Loralee Pacas [place]

## 2012-06-17 ENCOUNTER — Ambulatory Visit (INDEPENDENT_AMBULATORY_CARE_PROVIDER_SITE_OTHER): Payer: Medicaid Other | Admitting: Family Medicine

## 2012-06-17 ENCOUNTER — Encounter: Payer: Self-pay | Admitting: Family Medicine

## 2012-06-17 VITALS — BP 118/76 | HR 88 | Temp 97.7°F | Ht 69.0 in | Wt 202.9 lb

## 2012-06-17 DIAGNOSIS — R748 Abnormal levels of other serum enzymes: Secondary | ICD-10-CM

## 2012-06-17 DIAGNOSIS — E875 Hyperkalemia: Secondary | ICD-10-CM | POA: Insufficient documentation

## 2012-06-17 DIAGNOSIS — E785 Hyperlipidemia, unspecified: Secondary | ICD-10-CM

## 2012-06-17 DIAGNOSIS — R799 Abnormal finding of blood chemistry, unspecified: Secondary | ICD-10-CM

## 2012-06-17 DIAGNOSIS — R7989 Other specified abnormal findings of blood chemistry: Secondary | ICD-10-CM

## 2012-06-17 LAB — COMPREHENSIVE METABOLIC PANEL
ALT: 63 U/L — ABNORMAL HIGH (ref 0–53)
AST: 27 U/L (ref 0–37)
Alkaline Phosphatase: 241 U/L — ABNORMAL HIGH (ref 39–117)
Creat: 1.38 mg/dL — ABNORMAL HIGH (ref 0.50–1.35)
Sodium: 136 mEq/L (ref 135–145)
Total Bilirubin: 1.3 mg/dL — ABNORMAL HIGH (ref 0.3–1.2)

## 2012-06-17 LAB — LDL CHOLESTEROL, DIRECT: Direct LDL: 113 mg/dL — ABNORMAL HIGH

## 2012-06-17 NOTE — Patient Instructions (Addendum)
It was nice seeing you today.  I am checking some lab work today.  I will call you with the results.  Depending on your cholesterol, I may increase your Pravastatin.  Please follow up with me in 1 month.  Remember to bring your medications and your log of your blood sugars (date, time, and blood sugar reading)

## 2012-06-17 NOTE — Assessment & Plan Note (Signed)
Likely secondary to poor diabetes control.  This may be patient's new baseline creatinine.  However, appears to be improving (1.6 to 1.49). Will monitor closely and consider further work up if does not improve.  Patient encouraged to stay well hydrated.

## 2012-06-17 NOTE — Progress Notes (Signed)
Subjective:     Patient ID: Tristan Pugh, male   DOB: 04/08/1966, 46 y.o.   MRN: [id number]  HPI Tristan Pugh presents to the clinic today for follow up regarding recent abnormal lab study.  Recent CMP revealed elevated BUN/Creatinine, Hyperkalemia, and elevated AST/ALT and alkaline phosphatase.  Patient is currently not having any symptoms and overall is feeling well.    Review of Systems Denies chest pain, SOB, abdominal pain.  Does report fatigue.    Objective:   Physical Exam General: well appearing, NAD. Heart: RRR. No murmurs, rubs, or gallops. Lungs: CTAB. No rales, rhonchi, or wheeze. Abdomen: soft, nontender, nondistended. Ext: no cyanosis, clubbing or edema.    Assessment:         Plan:

## 2012-06-17 NOTE — Assessment & Plan Note (Signed)
Mild hyperkalemia noted.  Likely secondary to elevated BUN/Creatinine.  Will recheck via CMP today. Will continue to monitor closely.  Repeat CMP in 1 month.

## 2012-06-17 NOTE — Assessment & Plan Note (Signed)
Will continue to follow closely.  Will recheck in 1 month.

## 2012-06-17 NOTE — Assessment & Plan Note (Signed)
Prior AST/ALT were normal (1 year ago).  Patient has had recent addition of Trilipix and subsequent allergic reaction.  This is a potential source of AST/ALT elevation.  Antiretrovirals also potential source.  Will continue to monitor closely.  Repeat CMP today and will reassess in 1 month.

## 2012-06-19 NOTE — ED Notes (Signed)
Pt called stating that he can no longer take pedinisone because it caused his blood sugar level to go extremely high and he had to be hospitalized and wants this med added to allergy list. Mw,cma

## 2012-06-24 ENCOUNTER — Encounter: Payer: Self-pay | Admitting: Internal Medicine

## 2012-06-24 ENCOUNTER — Other Ambulatory Visit: Payer: Self-pay | Admitting: *Deleted

## 2012-06-24 ENCOUNTER — Telehealth: Payer: Self-pay | Admitting: Family Medicine

## 2012-06-24 ENCOUNTER — Ambulatory Visit (INDEPENDENT_AMBULATORY_CARE_PROVIDER_SITE_OTHER): Payer: Medicaid Other | Admitting: Internal Medicine

## 2012-06-24 VITALS — BP 141/93 | HR 74 | Temp 97.8°F | Ht 69.5 in | Wt 205.0 lb

## 2012-06-24 DIAGNOSIS — B20 Human immunodeficiency virus [HIV] disease: Secondary | ICD-10-CM

## 2012-06-24 DIAGNOSIS — Z23 Encounter for immunization: Secondary | ICD-10-CM

## 2012-06-24 DIAGNOSIS — R7989 Other specified abnormal findings of blood chemistry: Secondary | ICD-10-CM

## 2012-06-24 DIAGNOSIS — R799 Abnormal finding of blood chemistry, unspecified: Secondary | ICD-10-CM

## 2012-06-24 MED ORDER — RITONAVIR 100 MG PO TABS: 100.0000 mg | ORAL_TABLET | Freq: Every day | ORAL | Status: DC

## 2012-06-24 MED ORDER — ATAZANAVIR SULFATE 300 MG PO CAPS: 300.0000 mg | ORAL_CAPSULE | Freq: Every day | ORAL | Status: DC

## 2012-06-24 MED ORDER — EMTRICITABINE-TENOFOVIR DF 200-300 MG PO TABS: 1.0000 | ORAL_TABLET | Freq: Every day | ORAL | Status: DC

## 2012-06-24 NOTE — Assessment & Plan Note (Signed)
He is going back to his primary physician to continue to work on this. At this time there is no indication for dosage change that to discuss this with him that that may be needed in the future.

## 2012-06-24 NOTE — Progress Notes (Signed)
  Subjective:    Patient ID: Tristan Pugh, male    DOB: 08/13/1965, 46 y.o.   MRN: [id number]  HPI He comes in here for followup after a long absence. He was last seen July of 2012 and has been on a regimen of Reyataz, Norvir and Truvada. He reports good compliance and most recent labs prior to this visit do show a nearly undetectable viral load and CD4 count of 370. He denies stopping the medication that appears his refills from here have been expired. He does have complaints of fatigue and is working with the primary doctor for his diabetes and other issues.   Review of Systems  Constitutional: Positive for fatigue. Negative for fever and chills.  HENT: Negative for sore throat and trouble swallowing.   Respiratory: Negative for cough and shortness of breath.   Cardiovascular: Negative for chest pain, palpitations and leg swelling.  Gastrointestinal: Negative for nausea, abdominal pain and diarrhea.  Musculoskeletal: Negative for myalgias, joint swelling and arthralgias.  Skin: Negative for rash.  Neurological: Negative for dizziness and headaches.       Objective:   Physical Exam  Constitutional: He appears well-developed and well-nourished. No distress.  Cardiovascular: Normal rate, regular rhythm and normal heart sounds.  Exam reveals no gallop and no friction rub.   No murmur heard. Pulmonary/Chest: Effort normal and breath sounds normal. No respiratory distress. He has no wheezes. He has no rales.  Lymphadenopathy:    He has no cervical adenopathy.          Assessment & Plan:

## 2012-06-24 NOTE — Telephone Encounter (Signed)
Called Mr. Nahar to inform him that his labs were improving.  However, will hold Metformin until renal function is improved to WNL.

## 2012-06-24 NOTE — Assessment & Plan Note (Signed)
He is doing well despite poor followup. He will continue with his current regimen. His creatinine is elevated and I will keep a close eye on that and have him recheck in 3 months. I encouraged more close followup here for his HIV. I also will check an HLA B. 5701 test in case he needs to be changed to an abacavir containing regimen

## 2012-09-18 ENCOUNTER — Other Ambulatory Visit: Payer: Medicaid Other

## 2012-10-02 ENCOUNTER — Telehealth: Payer: Self-pay | Admitting: *Deleted

## 2012-10-02 ENCOUNTER — Ambulatory Visit: Payer: Medicaid Other | Admitting: Internal Medicine

## 2012-10-02 NOTE — Telephone Encounter (Signed)
Cancelled patient's appt, as he has an appt next week with Lesle Reek to apply for  [employer]. He purposely did not do labs or come to visit because he is waiting to get his Tristan Pugh, he no longer has Medicaid. Wendall Mola

## 2012-10-09 ENCOUNTER — Ambulatory Visit: Payer: Self-pay

## 2012-10-16 ENCOUNTER — Ambulatory Visit: Payer: Self-pay

## 2012-11-18 ENCOUNTER — Telehealth: Payer: Self-pay

## 2012-11-18 NOTE — Telephone Encounter (Signed)
Pt calling for referral to Dr Terri Piedra for a rash. He states a referral is needed since they are showing he has medicaid.  We are not the primary physician and the ID physician has not seen him for this rash.  Pt informed he will need to call his primary care office for referral since we are specialist.   Laurell Josephs, RN

## 2012-11-20 ENCOUNTER — Telehealth: Payer: Self-pay | Admitting: Family Medicine

## 2012-11-20 ENCOUNTER — Encounter (HOSPITAL_COMMUNITY): Payer: Self-pay | Admitting: Emergency Medicine

## 2012-11-20 ENCOUNTER — Emergency Department (HOSPITAL_COMMUNITY)
Admission: EM | Admit: 2012-11-20 | Discharge: 2012-11-20 | Disposition: A | Discharge status: Home / Self Care | Payer: No Typology Code available for payment source | Attending: Emergency Medicine | Admitting: Emergency Medicine

## 2012-11-20 ENCOUNTER — Other Ambulatory Visit: Payer: Self-pay

## 2012-11-20 DIAGNOSIS — R413 Other amnesia: Secondary | ICD-10-CM | POA: Insufficient documentation

## 2012-11-20 DIAGNOSIS — E119 Type 2 diabetes mellitus without complications: Secondary | ICD-10-CM | POA: Insufficient documentation

## 2012-11-20 DIAGNOSIS — Z8673 Personal history of transient ischemic attack (TIA), and cerebral infarction without residual deficits: Secondary | ICD-10-CM | POA: Insufficient documentation

## 2012-11-20 DIAGNOSIS — Z794 Long term (current) use of insulin: Secondary | ICD-10-CM | POA: Insufficient documentation

## 2012-11-20 DIAGNOSIS — Z8669 Personal history of other diseases of the nervous system and sense organs: Secondary | ICD-10-CM | POA: Insufficient documentation

## 2012-11-20 DIAGNOSIS — Z7982 Long term (current) use of aspirin: Secondary | ICD-10-CM | POA: Insufficient documentation

## 2012-11-20 DIAGNOSIS — D849 Immunodeficiency, unspecified: Secondary | ICD-10-CM | POA: Insufficient documentation

## 2012-11-20 DIAGNOSIS — Z79899 Other long term (current) drug therapy: Secondary | ICD-10-CM | POA: Insufficient documentation

## 2012-11-20 DIAGNOSIS — H538 Other visual disturbances: Secondary | ICD-10-CM | POA: Insufficient documentation

## 2012-11-20 DIAGNOSIS — H547 Unspecified visual loss: Secondary | ICD-10-CM

## 2012-11-20 LAB — COMPREHENSIVE METABOLIC PANEL
ALT: 24 U/L (ref 0–53)
AST: 34 U/L (ref 0–37)
Albumin: 3.8 g/dL (ref 3.5–5.2)
Calcium: 9.3 mg/dL (ref 8.4–10.5)
Creatinine, Ser: 1.75 mg/dL — ABNORMAL HIGH (ref 0.50–1.35)
Sodium: 136 mEq/L (ref 135–145)
Total Protein: 8.9 g/dL — ABNORMAL HIGH (ref 6.0–8.3)

## 2012-11-20 LAB — CBC WITH DIFFERENTIAL/PLATELET
Basophils Absolute: 0 10*3/uL (ref 0.0–0.1)
Basophils Relative: 1 % (ref 0–1)
Eosinophils Relative: 2 % (ref 0–5)
HCT: 37.7 % — ABNORMAL LOW (ref 39.0–52.0)
MCHC: 33.4 g/dL (ref 30.0–36.0)
MCV: 83.8 fL (ref 78.0–100.0)
Monocytes Absolute: 0.4 10*3/uL (ref 0.1–1.0)
Platelets: 286 10*3/uL (ref 150–400)
RDW: 13.1 % (ref 11.5–15.5)
WBC: 5.3 10*3/uL (ref 4.0–10.5)

## 2012-11-20 MED ORDER — TETRACAINE HCL 0.5 % OP SOLN
2.0000 [drp] | Freq: Once | OPHTHALMIC | Status: AC
  Administered 2012-11-20: 2 [drp] via OPHTHALMIC
  Filled 2012-11-20: qty 2

## 2012-11-20 MED ORDER — FLUORESCEIN SODIUM 1 MG OP STRP
1.0000 | ORAL_STRIP | Freq: Once | OPHTHALMIC | Status: AC
  Administered 2012-11-20: 19:00:00 via OPHTHALMIC
  Filled 2012-11-20: qty 1

## 2012-11-20 NOTE — ED Notes (Signed)
Pt states he does not wear glasses or contacts at all

## 2012-11-20 NOTE — ED Notes (Signed)
ekg given to dr. Rubin Payor.  (not [place])

## 2012-11-20 NOTE — Telephone Encounter (Signed)
Pt lives in [place] and wants to know if there is an eye doctor in [place] that he can see that accepts the orange card.

## 2012-11-20 NOTE — ED Notes (Signed)
Visual acuity resulted:  Right eye: 20/25    Left eye: 20/100   Both: 20/25  Pt states he only wears reading glasses to read up close

## 2012-11-20 NOTE — Telephone Encounter (Signed)
Spoke with patietn and he is wanting numerous referrals. I informed him that being that he has not been seen by Dr. Adriana Simas since 06/2012

## 2012-11-20 NOTE — ED Notes (Signed)
Onset 2 days ago sweating during exercising and went into left eye.  States for two days left eye shadowing "Grey" and "dark" circle.  Ax4 answering and following commands appropriate. Denies any pain.

## 2012-11-20 NOTE — ED Provider Notes (Signed)
History     CSN: [id number]  Arrival date & time 11/20/12  1519   First MD Initiated Contact with Patient 11/20/12 1630      Chief Complaint  Patient presents with  . Eye Problem    (Consider location/radiation/quality/duration/timing/severity/associated sxs/prior treatment) HPI  Tristan Pugh is a 47 y.o. male with insulin dependent diabetes and history of left cerebellar CVA presenting with painless decrease in visual acuity in the left eye onset 48 hours ago while patient was working out. Patient states that the vision loss is the most profound in the center of the left eye he described as black and then as you move out towards the periphery of the vision the acuity improved he describes this as grade. Patient denies any pain, photophobia, headache, chest pain, shortness of breath, dysarthria, ataxia, unilateral weakness. Patient does not wear contact lenses only wears reading glasses occasionally.  Past Medical History  Diagnosis Date  . 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 Immune deficiency disorder   . Diabetes mellitus without complication   . Neuropathy   . Short-term memory loss     Past Surgical History  Procedure Laterality Date  . Foot surgery      Family History  Problem Relation Age of Onset  . Stroke Father   . Hypertension Mother   . Hypertension Sister     History  Substance Use Topics  . Smoking status: Never Smoker   . Smokeless tobacco: Never Used  . Alcohol Use: Yes     Comment: occasional      Review of Systems  Constitutional: Negative for fever.  Eyes: Positive for visual disturbance. Negative for photophobia and redness.  Respiratory: Negative for shortness of breath.   Cardiovascular: Negative for chest pain.  Gastrointestinal: Negative for nausea, vomiting, abdominal pain and diarrhea.  Neurological: Negative for speech difficulty, weakness and headaches.  All other systems reviewed and are  negative.    Allergies  Dapsone; Prednisone; Trilipix; Ciprofloxacin; and Sulfonamide derivatives  Home Medications   Current Outpatient Rx  Name  Route  Sig  Dispense  Refill  . aspirin 81 MG tablet   Oral   Take 81 mg by mouth daily.         Marland Kitchen atazanavir (REYATAZ) 300 MG capsule   Oral   Take 1 capsule (300 mg total) by mouth daily with breakfast.   30 capsule   6   . emtricitabine-tenofovir (TRUVADA) 200-300 MG per tablet   Oral   Take 1 tablet by mouth daily.   30 tablet   6   . glucose blood (ACCU-CHEK AVIVA) test strip   Other   1 each by Other route daily. Use as instructed          . insulin glargine (LANTUS) 100 UNIT/ML injection   Subcutaneous   Inject 18 Units into the skin at bedtime.   10 mL   3   . lisinopril (PRINIVIL,ZESTRIL) 10 MG tablet   Oral   Take 10 mg by mouth daily.         . pravastatin (PRAVACHOL) 40 MG tablet   Oral   Take 40 mg by mouth every morning.          . ritonavir (NORVIR) 100 MG TABS   Oral   Take 1 tablet (100 mg total) by mouth daily.   30 tablet   6     BP 160/91  Pulse 65  Temp(Src) 97.7  F (36.5 C) (Oral)  Resp 20  SpO2 100%  Physical Exam  Nursing note and vitals reviewed. Constitutional: He is oriented to person, place, and time. He appears well-developed and well-nourished. No distress.  HENT:  Head: Normocephalic.  Eyes: Conjunctivae, EOM and lids are normal. Pupils are equal, round, and reactive to light. No foreign bodies found. Left eye exhibits no chemosis, no discharge, no exudate and no hordeolum. No foreign body present in the left eye. Left conjunctiva is not injected. Left conjunctiva has no hemorrhage. Left eye exhibits normal extraocular motion and no nystagmus. Left pupil is round and reactive. Pupils are equal.  Fundoscopic exam:      The left eye shows no exudate, no hemorrhage and no papilledema.  Slit lamp exam:      The left eye shows no corneal abrasion, no corneal flare, no  corneal ulcer, no foreign body, no hyphema and no hypopyon.  Visual fields normal by confrontational exam bilaterally, patient can count fingers equally on both sides however he states it is more blurry on the left.  Bedside ultrasound is not consistent with retinal detachment  Neck: Normal range of motion.  Cardiovascular: Normal rate.   Pulmonary/Chest: Effort normal. No stridor.  Musculoskeletal: Normal range of motion.  Neurological: He is alert and oriented to person, place, and time.  Psychiatric: He has a normal mood and affect.    ED Course  Procedures (including critical care time)  Labs Reviewed  CBC WITH DIFFERENTIAL - Abnormal; Notable for the following:    Hemoglobin 12.6 (*)    HCT 37.7 (*)    Neutrophils Relative 30 (*)    Neutro Abs 1.6 (*)    Lymphocytes Relative 60 (*)    All other components within normal limits  COMPREHENSIVE METABOLIC PANEL - Abnormal; Notable for the following:    Glucose, Bld 194 (*)    Creatinine, Ser 1.75 (*)    Total Protein 8.9 (*)    GFR calc non Af Amer 44 (*)    GFR calc Af Amer 51 (*)    All other components within normal limits   No results found.  Right eye: 20/25 Left eye: 20/100 Both: 20/25  1. Decreased visual acuity   2. Insulin dependent diabetes mellitus       MDM   Tristan Pugh is a 47 y.o. male acute onset of painless vision loss in the left eye 48 hours ago. Pattern is not consistent with retinal detachment, bedside ultrasound does not show any abnormalities consistent with retinal detachment either.  Patient has an elevated creatinine of 1.75 this is not normal for his baseline. Glucose is 194, patient states that he does not know what his A1c is and state that his diabetes is normally well controlled  Ophthalmology consult from Dr. 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 He will see the patient in his office next week on Tuesday as instructed me  to tell the patient if his conditions worsens he is to call the office for an emergent visit.  Discussed case with attending who agrees with plan and stability to d/c to home.    Filed Vitals:   11/20/12 1523  BP: 160/91  Pulse: 65  Temp: 97.7 F (36.5 C)  TempSrc: Oral  Resp: 20  SpO2: 100%     VSS and patient is appropriate for, and amenable to, discharge at this time. Pt verbalized understanding and agrees with care plan. Outpatient  follow-up and return precautions given.         Wynetta Emery, PA-C 11/21/12 1933

## 2012-11-21 ENCOUNTER — Telehealth: Payer: Self-pay | Admitting: Family Medicine

## 2012-11-21 NOTE — Telephone Encounter (Signed)
LVM for patient to call back. We spoke yesterday about the [facility] situation with him and the referrals requested. This referral will still have to be sent through the project access. If he wants to get seen sooner he will have to contact the doctors office to see how much he will have to pay out of pocket to be seen

## 2012-11-21 NOTE — ED Provider Notes (Signed)
  Medical screening examination/treatment/procedure(s) were performed by non-physician practitioner and as supervising physician I was immediately available for consultation/collaboration.    Wahid Holley, MD 11/21/12 2247 

## 2012-11-21 NOTE — Telephone Encounter (Signed)
Patient returned call and was given the message below and voiced understanding.

## 2012-11-21 NOTE — Telephone Encounter (Signed)
Patient was seen in the ER for a eye problem that needs immediate attention from an eye doctor.  He was told to contact Dr. Allyne Gee for an appointment, but he needs a referral from [facility] first.  Dr. Allyne Gee phone # is [phone].

## 2012-11-21 NOTE — Telephone Encounter (Signed)
Patient is calling back to check on this referral °

## 2012-11-26 ENCOUNTER — Encounter: Payer: Self-pay | Admitting: Family Medicine

## 2012-11-26 ENCOUNTER — Ambulatory Visit (INDEPENDENT_AMBULATORY_CARE_PROVIDER_SITE_OTHER): Payer: No Typology Code available for payment source | Admitting: Family Medicine

## 2012-11-26 VITALS — BP 139/76 | HR 71 | Temp 98.5°F | Ht 69.5 in | Wt 199.3 lb

## 2012-11-26 DIAGNOSIS — E119 Type 2 diabetes mellitus without complications: Secondary | ICD-10-CM

## 2012-11-26 DIAGNOSIS — H5462 Unqualified visual loss, left eye, normal vision right eye: Secondary | ICD-10-CM

## 2012-11-26 DIAGNOSIS — B20 Human immunodeficiency virus [HIV] disease: Secondary | ICD-10-CM

## 2012-11-26 DIAGNOSIS — L989 Disorder of the skin and subcutaneous tissue, unspecified: Secondary | ICD-10-CM | POA: Insufficient documentation

## 2012-11-26 DIAGNOSIS — H5460 Unqualified visual loss, one eye, unspecified: Secondary | ICD-10-CM

## 2012-11-26 DIAGNOSIS — E785 Hyperlipidemia, unspecified: Secondary | ICD-10-CM

## 2012-11-26 MED ORDER — PRAVASTATIN SODIUM 40 MG PO TABS: 40.0000 mg | ORAL_TABLET | Freq: Every morning | ORAL | Status: DC

## 2012-11-26 MED ORDER — ATAZANAVIR SULFATE 300 MG PO CAPS: 300.0000 mg | ORAL_CAPSULE | Freq: Every day | ORAL | Status: DC

## 2012-11-26 MED ORDER — GLUCOSE BLOOD VI STRP: ORAL_STRIP | Status: AC

## 2012-11-26 MED ORDER — EMTRICITABINE-TENOFOVIR DF 200-300 MG PO TABS: 1.0000 | ORAL_TABLET | Freq: Every day | ORAL | Status: DC

## 2012-11-26 MED ORDER — ASPIRIN 81 MG PO TABS: 81.0000 mg | ORAL_TABLET | Freq: Every day | ORAL | Status: DC

## 2012-11-26 MED ORDER — INSULIN GLARGINE 100 UNIT/ML ~~LOC~~ SOLN: 18.0000 [IU] | Freq: Every day | SUBCUTANEOUS | Status: DC

## 2012-11-26 MED ORDER — LISINOPRIL 10 MG PO TABS: 10.0000 mg | ORAL_TABLET | Freq: Every day | ORAL | Status: DC

## 2012-11-26 MED ORDER — ATORVASTATIN CALCIUM 40 MG PO TABS: 40.0000 mg | ORAL_TABLET | Freq: Every day | ORAL | Status: DC

## 2012-11-26 MED ORDER — RITONAVIR 100 MG PO TABS: 100.0000 mg | ORAL_TABLET | Freq: Every day | ORAL | Status: DC

## 2012-11-26 NOTE — Assessment & Plan Note (Signed)
A1C improved at 9.3 today. Insulin refilled.  Advised patient to return in 1 month with blood sugar readings to aid in titration of lantus and possibly starting Novolog TID.

## 2012-11-26 NOTE — Assessment & Plan Note (Signed)
Likely secondary to diabetic retinopathy.  Patient to have retinal scan today.  Will refer to Opthalmology.

## 2012-11-26 NOTE — Assessment & Plan Note (Signed)
No signs of infection or callus formation.  Advise daily checking of area, appropriate footing, and heel/sole inserts to alleviate pressure which could lead to callus or ulcer.

## 2012-11-26 NOTE — Patient Instructions (Addendum)
Please follow up with me in 1 month.  Bring a record of your blood sugars (include times please).  I have refilled all of your medication. [facility]  I will also be sending in a referral to opthalmology.

## 2012-11-26 NOTE — Progress Notes (Signed)
Subjective:     Patient ID: Tristan Pugh, male   DOB: 10-16-1965, 47 y.o.   MRN: [id number]  HPI 47 year old male with DM, HTN, HLD, and HIV presents for follow up.  1) Diabetes - Disease Monitoring  Blood Sugar Ranges: Patient has not taken blood sugar in weeks as he is out of strips  Polyuria: No  Visual problems: Yes. Patient recently seen in the ED for visual loss of left eye. This began 5  days ago. - Medication Compliance: Patient endorses compliance but has been out of insulin for several days. - Medication Side Effects  Hypoglycemia: No - Prevent[facility] Health Care  Eye Exam: Patient needs an eye exam as soon as possible.   2) Heel blister/sore - This is a new problem for the patient - Patient reports that he noticed peeling of the skin of his right heel a few weeks ago - Patient has been treating this conservatively with topical antibiotic cream and placing bandaids. - ROS:  No injury or trauma. No drainage, redness, or associated pain. No fevers, chills.   3) Visual loss - Patient reports 5 day history of central vision loss of his left eye - This came on suddenly. No trauma or injury. No associated pain. - He reports his central vision is poor.  He describes the center of his vision being blackened out.  This has continue to persist and is moderate to severe. No modifying factors. - Patient seen in the ED for this on 5/8.  Case was discussed with Opthalmology who felt this was Diabetic retinopathy with macular edema.  They advised patient to follow up.  Social History - Nonsmoker.  Review of Systems See HPI.    Objective:   Physical Exam Filed Vitals:   11/26/12 1512  BP: 139/76  Pulse: 71  Temp: 98.5 F (36.9 C)   General: well appearing, NAD. HEENT: Eyes: fundoscopic exam deferred as patient will have retinal scan today. Cardiovascular: RRR. No murmurs, rubs, or gallops. Respiratory: CTAB. No rales, rhonchi, or wheeze. Abdomen: soft, nontender,  nondistended. Diabetic Foot Check:  Appearance: Right foot - mild skin peeling of the right heel. No erythema or drainage.  No localized infection or abscess.  No calluses. Skin: no unusual pallor or redness Monofilament testing: Right - Great toe, medial, central, lateral ball and posterior foot intact Left - Great toe, medial, central, lateral ball and posterior foot intact     Assessment:    See problem list.  Plan:

## 2012-11-26 NOTE — Assessment & Plan Note (Signed)
Switching to Lipitor 40 mg daily as patient is Diabetic with ASCVD 10 year risk of 18.1%.

## 2012-11-28 ENCOUNTER — Telehealth: Payer: Self-pay | Admitting: Family Medicine

## 2012-11-28 ENCOUNTER — Telehealth: Payer: Self-pay | Admitting: *Deleted

## 2012-11-28 DIAGNOSIS — E119 Type 2 diabetes mellitus without complications: Secondary | ICD-10-CM

## 2012-11-28 MED ORDER — INSULIN GLARGINE 100 UNIT/ML ~~LOC~~ SOLN: 18.0000 [IU] | Freq: Every day | SUBCUTANEOUS | Status: DC

## 2012-11-28 NOTE — Telephone Encounter (Signed)
Patient is calling because when he was seen yesterday, he got all of his prescriptions accept his Lantus and he needs that prescription.  He has been out of it for 5 days.  He uses [facility] on [address] and he needs the nurse to call him when this has been sent.

## 2012-11-28 NOTE — Telephone Encounter (Signed)
Pt called again. He is very concerned about his Lantus since he has been out 5 days. Needs to know what to do

## 2012-11-28 NOTE — Telephone Encounter (Signed)
Pt lantus rx sent electronically - pt called to inform Wyatt Haste, RN-BSN

## 2012-11-28 NOTE — Telephone Encounter (Signed)
Lantus prescription [facility] ( [facility] pharmacy) - this was cancelled via phone. Lantus prescription verbally called into [facility] on [address] and pt preferred pharmacy changed in medical record. No further concerns. Wyatt Haste, RN-BSN

## 2012-12-03 ENCOUNTER — Emergency Department (HOSPITAL_COMMUNITY)
Admission: EM | Admit: 2012-12-03 | Discharge: 2012-12-03 | Discharge status: Against Medical Advice | Payer: No Typology Code available for payment source | Attending: Emergency Medicine | Admitting: Emergency Medicine

## 2012-12-03 ENCOUNTER — Encounter (HOSPITAL_COMMUNITY): Payer: Self-pay | Admitting: Adult Health

## 2012-12-03 ENCOUNTER — Emergency Department (HOSPITAL_COMMUNITY): Payer: No Typology Code available for payment source

## 2012-12-03 DIAGNOSIS — R5381 Other malaise: Secondary | ICD-10-CM | POA: Insufficient documentation

## 2012-12-03 DIAGNOSIS — R5383 Other fatigue: Secondary | ICD-10-CM | POA: Insufficient documentation

## 2012-12-03 LAB — CBC WITH DIFFERENTIAL/PLATELET
Eosinophils Relative: 0 % (ref 0–5)
HCT: 36.5 % — ABNORMAL LOW (ref 39.0–52.0)
Hemoglobin: 12 g/dL — ABNORMAL LOW (ref 13.0–17.0)
Lymphocytes Relative: 14 % (ref 12–46)
Lymphs Abs: 1 10*3/uL (ref 0.7–4.0)
MCV: 84.5 fL (ref 78.0–100.0)
Monocytes Absolute: 0.6 10*3/uL (ref 0.1–1.0)
Monocytes Relative: 8 % (ref 3–12)
Platelets: 284 10*3/uL (ref 150–400)
RBC: 4.32 MIL/uL (ref 4.22–5.81)
WBC: 7.2 10*3/uL (ref 4.0–10.5)

## 2012-12-03 LAB — POCT I-STAT TROPONIN I: Troponin i, poc: 0.02 ng/mL (ref 0.00–0.08)

## 2012-12-03 LAB — COMPREHENSIVE METABOLIC PANEL
ALT: 20 U/L (ref 0–53)
BUN: 20 mg/dL (ref 6–23)
CO2: 21 mEq/L (ref 19–32)
Calcium: 10.2 mg/dL (ref 8.4–10.5)
GFR calc Af Amer: 29 mL/min — ABNORMAL LOW (ref >90)
GFR calc non Af Amer: 25 mL/min — ABNORMAL LOW (ref >90)
Glucose, Bld: 298 mg/dL — ABNORMAL HIGH (ref 70–99)
Sodium: 136 mEq/L (ref 135–145)

## 2012-12-03 NOTE — ED Notes (Signed)
Called to reassess and no answer 

## 2012-12-03 NOTE — ED Notes (Addendum)
Pt reports being outside in the heat drinking water and soda and began to feel weak all over and fatigued. He states, "I just feel drained. I don't know what is going on. I thought maybe my blood sugar went down but I drank soda. IT usually runs around the 130s-150s. I don't hurt anywhere. I threw up a couple times. I feel dehydrated" pt is alert, oriented, answers all questions appropriately.  HR 108, Bp 94/69. Pt reports slight nausea and SOB while outside.

## 2012-12-03 NOTE — ED Notes (Signed)
Pt came up to nurse fist to ask about wait. Pt was told he had 1 person ahead of him, but several acuity 2's checked in and pt was told that 4 pts were ahead of him. Pt states that he needed to leave to check on his pet. Pt explained the risks to leaving and still decided to leave.

## 2012-12-04 ENCOUNTER — Emergency Department (HOSPITAL_COMMUNITY)
Admission: EM | Admit: 2012-12-04 | Discharge: 2012-12-04 | Disposition: A | Discharge status: Home / Self Care | Payer: No Typology Code available for payment source | Attending: Emergency Medicine | Admitting: Emergency Medicine

## 2012-12-04 ENCOUNTER — Encounter (HOSPITAL_COMMUNITY): Payer: Self-pay | Admitting: Emergency Medicine

## 2012-12-04 ENCOUNTER — Telehealth: Payer: Self-pay | Admitting: Family Medicine

## 2012-12-04 DIAGNOSIS — R110 Nausea: Secondary | ICD-10-CM | POA: Insufficient documentation

## 2012-12-04 DIAGNOSIS — R55 Syncope and collapse: Secondary | ICD-10-CM | POA: Insufficient documentation

## 2012-12-04 DIAGNOSIS — Z8669 Personal history of other diseases of the nervous system and sense organs: Secondary | ICD-10-CM | POA: Insufficient documentation

## 2012-12-04 DIAGNOSIS — Z21 Asymptomatic human immunodeficiency virus [HIV] infection status: Secondary | ICD-10-CM | POA: Insufficient documentation

## 2012-12-04 DIAGNOSIS — Z794 Long term (current) use of insulin: Secondary | ICD-10-CM | POA: Insufficient documentation

## 2012-12-04 DIAGNOSIS — Z79899 Other long term (current) drug therapy: Secondary | ICD-10-CM | POA: Insufficient documentation

## 2012-12-04 DIAGNOSIS — N289 Disorder of kidney and ureter, unspecified: Secondary | ICD-10-CM | POA: Insufficient documentation

## 2012-12-04 DIAGNOSIS — Z8673 Personal history of transient ischemic attack (TIA), and cerebral infarction without residual deficits: Secondary | ICD-10-CM | POA: Insufficient documentation

## 2012-12-04 DIAGNOSIS — E860 Dehydration: Secondary | ICD-10-CM | POA: Insufficient documentation

## 2012-12-04 DIAGNOSIS — E119 Type 2 diabetes mellitus without complications: Secondary | ICD-10-CM | POA: Insufficient documentation

## 2012-12-04 LAB — POCT I-STAT, CHEM 8
Chloride: 102 mEq/L (ref 96–112)
HCT: 39 % (ref 39.0–52.0)
Hemoglobin: 13.3 g/dL (ref 13.0–17.0)
Potassium: 4.8 mEq/L (ref 3.5–5.1)
Sodium: 136 mEq/L (ref 135–145)

## 2012-12-04 MED ORDER — SODIUM CHLORIDE 0.9 % IV BOLUS (SEPSIS)
1000.0000 mL | Freq: Once | INTRAVENOUS | Status: AC
  Administered 2012-12-04: 1000 mL via INTRAVENOUS

## 2012-12-04 MED ORDER — SODIUM CHLORIDE 0.9 % IV BOLUS (SEPSIS): 1000.0000 mL | Freq: Once | INTRAVENOUS | Status: DC

## 2012-12-04 NOTE — Telephone Encounter (Signed)
Received call from ED physician Dr. Anitra Lauth that patient was seen in ED yesterday for near syncopal event after painting outside in the sun.  Had lab work drawn, but after waiting 7 hours had to leave without complete workup.  Followed up in ED today, completely asymptomatic today.  Lab work however showed Cr of 2.7 yesterday (baseline 1.4-1.7) and 3.2 today.  He did go home and eat and drink after leaving the ED yesterday.   He does not wish to be admitted to the hospital today but EDP wanted to be sure he had close follow up at our office.  She will have him call for work in spot this afternoon at our office or [facility] tomorrow am. I asked her to have him hold his ACE-I.  May need dose of truvada adjusted based on Cr clearance

## 2012-12-04 NOTE — ED Notes (Signed)
Pt here yesterday  For feeling near syncopal while on ladder had labs and xrays but had to leave due to wait times and he felt better he had said he would f/u so he came back today for follow up

## 2012-12-04 NOTE — ED Provider Notes (Addendum)
History     CSN: [id number]  Arrival date & time 12/04/12  4098   First MD Initiated Contact with Patient 12/04/12 1008      No chief complaint on file.   (Consider location/radiation/quality/duration/timing/severity/associated sxs/prior treatment) HPI Comments: Patient states yesterday he waited in the emergency room waiting room for 7 hours and then had to go home to take care of his dog. He had blood tests drawn yesterday as well as an x-ray and an EKG. He states he has felt fine since leaving but came back today for followup he has no current complaint  Patient is a 47 y.o. male presenting with syncope. The history is provided by the patient.  Loss of Consciousness Episode history:  Single (Near-syncope but no true syncope) Most recent episode:  Yesterday Timing:  Constant Progression:  Resolved Chronicity:  New Context: dehydration   Context comment:  Was painting shutters outside in the sun on a ladder Relieved by:  Lying down, sugar/glucose and drinking Ineffective treatments:  None tried Associated symptoms: dizziness and nausea   Associated symptoms: no chest pain, no confusion, no fever, no focal weakness, no headaches, no palpitations, no recent fall, no seizures, no shortness of breath and no vomiting   Risk factors comment:  History of diabetes and HIV   Past Medical History  Diagnosis Date  . 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 Immune deficiency disorder   . Diabetes mellitus without complication   . Neuropathy   . Short-term memory loss     Past Surgical History  Procedure Laterality Date  . Foot surgery      Family History  Problem Relation Age of Onset  . Stroke Father   . Hypertension Mother   . Hypertension Sister     History  Substance Use Topics  . Smoking status: Never Smoker   . Smokeless tobacco: Never Used  . Alcohol Use: Yes     Comment: occasional      Review of Systems  Constitutional: Negative for fever.   Respiratory: Negative for shortness of breath.   Cardiovascular: Positive for syncope. Negative for chest pain and palpitations.  Gastrointestinal: Positive for nausea. Negative for vomiting.  Neurological: Positive for dizziness. Negative for focal weakness, seizures and headaches.  Psychiatric/Behavioral: Negative for confusion.  All other systems reviewed and are negative.    Allergies  Dapsone; Prednisone; Trilipix; Ciprofloxacin; and Sulfonamide derivatives  Home Medications   Current Outpatient Rx  Name  Route  Sig  Dispense  Refill  . aspirin 81 MG tablet   Oral   Take 1 tablet (81 mg total) by mouth daily.   30 tablet   6   . atazanavir (REYATAZ) 300 MG capsule   Oral   Take 1 capsule (300 mg total) by mouth daily with breakfast.   30 capsule   6   . atorvastatin (LIPITOR) 40 MG tablet   Oral   Take 1 tablet (40 mg total) by mouth daily.   30 tablet   6   . emtricitabine-tenofovir (TRUVADA) 200-300 MG per tablet   Oral   Take 1 tablet by mouth daily.   30 tablet   6   . glucose blood (ACCU-CHEK AVIVA) test strip      Up to 6 times daily.   100 each   6   . insulin glargine (LANTUS) 100 UNIT/ML injection   Subcutaneous   Inject 0.18 mLs (18 Units total) into the skin at  bedtime.   10 mL   6   . lisinopril (PRINIVIL,ZESTRIL) 10 MG tablet   Oral   Take 1 tablet (10 mg total) by mouth daily.   30 tablet   6   . ritonavir (NORVIR) 100 MG TABS   Oral   Take 1 tablet (100 mg total) by mouth daily.   30 tablet   6     BP 134/80  Pulse 68  Temp(Src) 98 F (36.7 C) (Oral)  Resp 20  SpO2 100%  Physical Exam  Nursing note and vitals reviewed. Constitutional: He is oriented to person, place, and time. He appears well-developed and well-nourished. No distress.  HENT:  Head: Normocephalic and atraumatic.  Mouth/Throat: Oropharynx is clear and moist.  Eyes: Conjunctivae and EOM are normal. Pupils are equal, round, and reactive to light.  Neck:  Normal range of motion. Neck supple.  Cardiovascular: Normal rate, regular rhythm and intact distal pulses.   No murmur heard. Pulmonary/Chest: Effort normal and breath sounds normal. No respiratory distress. He has no wheezes. He has no rales.  Abdominal: Soft. He exhibits no distension. There is no tenderness. There is no rebound and no guarding.  Musculoskeletal: Normal range of motion. He exhibits no edema and no tenderness.  Neurological: He is alert and oriented to person, place, and time.  Skin: Skin is warm and dry. No rash noted. No erythema.  Psychiatric: He has a normal mood and affect. His behavior is normal.    ED Course  Procedures (including critical care time)  Labs Reviewed  GLUCOSE, CAPILLARY - Abnormal; Notable for the following:    Glucose-Capillary 253 (*)    All other components within normal limits  POCT I-STAT, CHEM 8 - Abnormal; Notable for the following:    BUN 31 (*)    Creatinine, Ser 3.20 (*)    Glucose, Bld 221 (*)    All other components within normal limits   Dg Chest 2 View  12/03/2012   *RADIOLOGY REPORT*  Clinical Data: Weakness and lethargy  CHEST - 2 VIEW  Comparison: 04/05/2010  Findings: The heart size and mediastinal contours are within normal limits.  Both lungs are clear.  The visualized skeletal structures are unremarkable.  IMPRESSION: Negative exam.   Original Report Authenticated By: Signa Kell, M.D.     1. Acute renal insufficiency   2. Near syncope       MDM   Patient with a history of near syncope yesterday and lightheadedness. This occurred while he was painting shutters on a ladder in the sun. He states he had not drank any water and had eaten a small amount.  he was brought to the hospital yesterday however because of the 7 hour wait he had to go home.  Patient is just here for a followup today. He states he feels in his normal state of health and denies any chest pain, shortness of breath, syncope.  Blood work from  yesterday showed hyperglycemia as well as a new increasing creatinine of 2.79 from his normal which is between 1.4 and 1.7.  CBC was within normal limits, troponin was negative, and EKG was within normal limits and chest x-ray was normal.  Patient has a nonfocal exam. Will recheck i-STAT to see if creatinine is correct however feel patient most likely got dehydrated and had a near syncopal events do to the heat and dehydration. Patient was counseled to drink more fluids and avoid extremely hot weather  11:17 AM Creatinine is worse today at  3.2 from 2.79 yesterday. Discussed with patient about hydration and admission however patient does not want to be admitted because he is not prepared. He does see family practice will discuss with them to get a very close followup plan.  Unclear the cause of patient's progressively worsening renal failure as he has been eating and drinking and denies any recent medication changes.  11:48 AM Spoke with codeine Ashley Royalty with family practice and they can see the patient later today or tomorrow morning for further workup of his new renal insufficiency. Patient was instructed to hold his lisinopril until told otherwise. He was discharged    Gwyneth Sprout, MD 12/04/12 1118  Gwyneth Sprout, MD 12/04/12 1149  Gwyneth Sprout, MD 12/04/12 1153

## 2012-12-05 ENCOUNTER — Encounter: Payer: Self-pay | Admitting: Family Medicine

## 2012-12-05 ENCOUNTER — Ambulatory Visit (INDEPENDENT_AMBULATORY_CARE_PROVIDER_SITE_OTHER): Payer: No Typology Code available for payment source | Admitting: Family Medicine

## 2012-12-05 VITALS — BP 119/70 | HR 61 | Temp 98.0°F | Ht 69.5 in | Wt 198.6 lb

## 2012-12-05 DIAGNOSIS — N179 Acute kidney failure, unspecified: Secondary | ICD-10-CM

## 2012-12-05 LAB — POCT URINALYSIS DIPSTICK
Bilirubin, UA: NEGATIVE
Glucose, UA: 100
Leukocytes, UA: NEGATIVE
Nitrite, UA: NEGATIVE

## 2012-12-05 LAB — BASIC METABOLIC PANEL
CO2: 23 mEq/L (ref 19–32)
Chloride: 103 mEq/L (ref 96–112)
Potassium: 5 mEq/L (ref 3.5–5.3)

## 2012-12-05 LAB — POCT UA - MICROSCOPIC ONLY

## 2012-12-06 LAB — CREATININE, URINE, RANDOM: Creatinine, Urine: 152.1 mg/dL

## 2012-12-09 ENCOUNTER — Telehealth: Payer: Self-pay | Admitting: Family Medicine

## 2012-12-09 DIAGNOSIS — R7989 Other specified abnormal findings of blood chemistry: Secondary | ICD-10-CM

## 2012-12-09 NOTE — Telephone Encounter (Signed)
Please let patient know that his kidney function is improving.  He should plan on having this re-tested on Thursday or Friday.  I have placed the order and he can schedule a lab appointment at his convenience.

## 2012-12-09 NOTE — Telephone Encounter (Signed)
Pt notified.  Scheduled lab appt for Thursday 12/11/12 at 11am. Pt also mentioned he walked into Dr. Jacelyn Pi office who is an eye doctor and they said they would accept the orange card and he wouldn't have to wait four months for an appt. I told patient if that was the case, to call them and schedule himself an appt.     Rickardo Brinegar, Darlyne Russian, CMA

## 2012-12-09 NOTE — Telephone Encounter (Signed)
Spoke with Tristan Pugh re: orange card referrals and found out if patient were to schedule his own appt with a physician that accepts the orange card, that patient is responsible for the bill.  Pt. Notified. Emilie Rutter, Darlyne Russian, CMA

## 2012-12-11 ENCOUNTER — Encounter: Payer: Self-pay | Admitting: Family Medicine

## 2012-12-11 ENCOUNTER — Ambulatory Visit (INDEPENDENT_AMBULATORY_CARE_PROVIDER_SITE_OTHER): Payer: No Typology Code available for payment source | Admitting: Family Medicine

## 2012-12-11 VITALS — BP 127/79 | HR 66 | Temp 97.9°F | Ht 69.5 in | Wt 196.0 lb

## 2012-12-11 DIAGNOSIS — N179 Acute kidney failure, unspecified: Secondary | ICD-10-CM

## 2012-12-11 DIAGNOSIS — N183 Chronic kidney disease, stage 3 unspecified: Secondary | ICD-10-CM | POA: Insufficient documentation

## 2012-12-11 DIAGNOSIS — E119 Type 2 diabetes mellitus without complications: Secondary | ICD-10-CM

## 2012-12-11 NOTE — Patient Instructions (Addendum)
It was good to see you today.    Start taking your Truvada every other day.  I will call you with the results of your lab work.    Continue to stay well hydrated.  Continue your Lantus regimen and record your sugars in a log (take them 4 times a day - am, before lunch, before dinner, and at bedtime).  Please follow up with me in ~ 2 weeks.  If I need to see you sooner I will call.

## 2012-12-11 NOTE — Assessment & Plan Note (Signed)
Recent AKI, likely pre-renal azotemia as patient was outside for hours in the hot sun and had near-syncopal episode. Creatinine now improving - 3.2 --> 2.49. Will obtain metabolic panel today to reassess.  Patient not off lisinopril.  Renally adjusting Truvada given creatinine clearance (every 48 hours). Will see patient back in ~ 2 weeks to re-check creatinine.

## 2012-12-11 NOTE — Progress Notes (Signed)
Subjective:     Patient ID: Tristan Pugh, male   DOB: 12/29/1965, 47 y.o.   MRN: [id number]  HPI Tristan Pugh presents today for follow up.  Patient recently seen in the ED on 5/22 following a near syncopal episode.  His creatinine was significantly elevated at that time (3.2, baseline 1.4-1.8).   1) Elevated Creatinine/CKD - Upon review of the medical record, it appears that Tristan Pugh has baseline CKD stage III. - His creatinine has been markedly elevated recently after he was outdoors for hours in the sun and had a near syncopal episode.   - Last creatinine was 2.49 on 5/23.  FENa was 1.3%  2) DM-2 Disease Monitoring  Blood Sugar Ranges: 70-125 with occasional hypoglycemia ~65.  Last A1C - 9.3  Polyuria: no   Visual problems: yes. Awaiting referral for opthalmology.  Recent retinal scan negative for  diabetic retinopathy.  Medication Compliance: yes  Medication Side Effects  Hypoglycemia: yes   Preventitive Health Care  Eye Exam: Retinal scan recently.  Foot Exam: Yes  Diet pattern: Recently changed to vegetarian diet.  Review of Systems See HPI.  No further episodes of near syncope. Continues to have vision changes but this is improving.    Objective:   Physical Exam  Constitutional: He appears well-developed and well-nourished.  Cardiovascular: Normal rate and regular rhythm.   No murmur heard. Pulmonary/Chest: Breath sounds normal. He has no wheezes. He has no rales.  Abdominal: Soft. He exhibits no distension. There is no tenderness.  Musculoskeletal: He exhibits no edema.   Filed Vitals:   12/11/12 1442  BP: 127/79  Pulse: 66  Temp: 97.9 F (36.6 C)      Assessment:    See problem list.    Plan:

## 2012-12-11 NOTE — Assessment & Plan Note (Signed)
Doing well currently, but patient has had some hypoglycemia. Will continue current regimen of Lantus 18 units daily.  Instructed patient to log CBG readings and bring to his next visit for titration of insulin. Patient aware of hypoglycemia symptoms/warnings and knows how to appropriately respond.

## 2012-12-12 LAB — COMPLETE METABOLIC PANEL WITH GFR
ALT: 20 U/L (ref 0–53)
AST: 29 U/L (ref 0–37)
Alkaline Phosphatase: 103 U/L (ref 39–117)
CO2: 24 mEq/L (ref 19–32)
Creat: 1.9 mg/dL — ABNORMAL HIGH (ref 0.50–1.35)
GFR, Est African American: 47 mL/min — ABNORMAL LOW
Total Bilirubin: 2.2 mg/dL — ABNORMAL HIGH (ref 0.3–1.2)

## 2012-12-16 ENCOUNTER — Telehealth: Payer: Self-pay | Admitting: *Deleted

## 2012-12-16 NOTE — Telephone Encounter (Signed)
Message copied by Farrell Ours on Tue Dec 16, 2012 10:07 AM ------      Message from: Tommie Sams      Created: Mon Dec 15, 2012  6:38 PM      Regarding: Truvada dosing       Will you notify patient that he can resume daily dosing of Truvada as his creatinine has markedly improved. Patient still needs to follow up in ~ 2 weeks for recheck.            Thanks            [employer] DO ------

## 2012-12-16 NOTE — Telephone Encounter (Signed)
informed patient of below. He asked about eye doctor referral and I informed him that it was faxed out 5/15 and it takes around 2 months to get back to him

## 2012-12-17 NOTE — Assessment & Plan Note (Signed)
Repeat BMET today.  Most likely cause is pre-renal.  If resolving, will cont observation.  Otherwise will contact for admission.  If completely resolves, will be OK to start back on ACEI with close monitoring.

## 2012-12-17 NOTE — Progress Notes (Signed)
Patient ID: JAWANN URBANI, male   DOB: 10/05/1965, 47 y.o.   MRN: [id number] Subjective: The patient is a 47 y.o. year old male who presents today for ed fu.  1. AKI: Seen in Ed for this.  Admission was recommended. Pt declined.  Acute elevation of Cr from baseline of normal to 3.2.  No obvious cause, although patient does report working out in sun a fair amount recently.  Denies any pruritis, decrease in UOP, alteration in mental status, back pain, burning with urination, problems with incomplete emptying, or shortness of breath.  No LE edema.  Not interested in hospitalization at this time either.  Patient's past medical, social, and family history were reviewed and updated as appropriate. History  Substance Use Topics  . Smoking status: Never Smoker   . Smokeless tobacco: Never Used  . Alcohol Use: Yes     Comment: occasional   Objective:  Filed Vitals:   12/05/12 1127  BP: 119/70  Pulse: 61  Temp: 98 F (36.7 C)   Gen: NAD CV: RRR, no murmur Resp: CTABL, no crackles Ext: 2+pulses, no appreciable edema  Bedside US performed following spontaneous void.  Shows minimal remaining urine in bladder.  Assessment/Plan:  Please also see individual problems in problem list for problem-specific plans.

## 2012-12-19 ENCOUNTER — Encounter: Payer: Self-pay | Admitting: *Deleted

## 2012-12-19 NOTE — Telephone Encounter (Signed)
Opened in error patient does not get his 042 meds from this office.

## 2012-12-22 ENCOUNTER — Other Ambulatory Visit: Payer: No Typology Code available for payment source

## 2013-01-05 ENCOUNTER — Ambulatory Visit: Payer: No Typology Code available for payment source | Admitting: Internal Medicine

## 2013-01-13 ENCOUNTER — Encounter: Payer: Self-pay | Admitting: Internal Medicine

## 2013-01-13 ENCOUNTER — Ambulatory Visit (INDEPENDENT_AMBULATORY_CARE_PROVIDER_SITE_OTHER): Payer: No Typology Code available for payment source | Admitting: Internal Medicine

## 2013-01-13 VITALS — BP 120/75 | HR 72 | Temp 98.3°F | Ht 69.0 in | Wt 190.0 lb

## 2013-01-13 DIAGNOSIS — N179 Acute kidney failure, unspecified: Secondary | ICD-10-CM

## 2013-01-13 DIAGNOSIS — Z113 Encounter for screening for infections with a predominantly sexual mode of transmission: Secondary | ICD-10-CM

## 2013-01-13 DIAGNOSIS — B20 Human immunodeficiency virus [HIV] disease: Secondary | ICD-10-CM

## 2013-01-13 LAB — CBC WITH DIFFERENTIAL/PLATELET
Basophils Relative: 0 % (ref 0–1)
Eosinophils Absolute: 0.1 10*3/uL (ref 0.0–0.7)
Eosinophils Relative: 2 % (ref 0–5)
HCT: 33.5 % — ABNORMAL LOW (ref 39.0–52.0)
Hemoglobin: 11.1 g/dL — ABNORMAL LOW (ref 13.0–17.0)
MCH: 27.2 pg (ref 26.0–34.0)
MCHC: 33.1 g/dL (ref 30.0–36.0)
Monocytes Absolute: 0.4 10*3/uL (ref 0.1–1.0)
Monocytes Relative: 7 % (ref 3–12)

## 2013-01-13 NOTE — Progress Notes (Signed)
  Subjective:    Patient ID: Tristan Pugh, male    DOB: 11-03-1965, 47 y.o.   MRN: [id number]  HPI He comes in for routine followup. It has been Route 6 months since his last visit. He has been on Reyataz, Norvir and Truvada and reports excellent compliance.  His last CD4 count was 378 with a viral load of just 21 copies. Previous to that he did have more detectable virus in the 300s. He has been on his medications and has had episode of acute kidney injury due to presumed dehydration and possibly to his blood sugar, hypertension and other issues. His last creatinine did show a GFR of about just under 50 and he continues to take Truvada daily. Initially it was changed to every other day however his creatinine did significantly improve. He reports no significant problems and does take his medicine well. His last hemoglobin A1c was 9.3. No weight loss, diarrhea and no joint complaints.   Review of Systems  Constitutional: Negative for fever, chills and fatigue.  HENT: Negative for sore throat and trouble swallowing.   Cardiovascular: Negative for chest pain and leg swelling.  Gastrointestinal: Negative for nausea, abdominal pain and diarrhea.  Musculoskeletal: Negative for myalgias and arthralgias.  Skin: Negative for rash.  Neurological: Negative for dizziness and headaches.  Hematological: Negative for adenopathy.       Objective:   Physical Exam  Constitutional: He appears well-developed and well-nourished. No distress.  HENT:  Mouth/Throat: No oropharyngeal exudate.  Eyes: Right eye exhibits no discharge. Left eye exhibits no discharge. No scleral icterus.  Cardiovascular: Normal rate, regular rhythm and normal heart sounds.   No murmur heard. Pulmonary/Chest: Effort normal and breath sounds normal. No respiratory distress. He has no wheezes.  Lymphadenopathy:    He has no cervical adenopathy.  Skin: Skin is warm and dry. No rash noted.          Assessment & Plan:

## 2013-01-13 NOTE — Assessment & Plan Note (Signed)
I'm going to check his labs today to see if he continues to have good immunosuppression. I also will check HLA test for consideration of a pack of beer with his renal insufficiency unless it is completely improved. If he needs it medication change, he will be called otherwise she will followup in 3 months.

## 2013-01-13 NOTE — Assessment & Plan Note (Signed)
I am going to check his creatinine again today. Medication may need to be adjusted if no improvement

## 2013-01-14 LAB — HIV-1 RNA QUANT-NO REFLEX-BLD: HIV-1 RNA Quant, Log: <1.3 {Log} (ref <1.30)

## 2013-01-14 LAB — COMPLETE METABOLIC PANEL WITH GFR
ALT: 17 U/L (ref 0–53)
Alkaline Phosphatase: 97 U/L (ref 39–117)
CO2: 21 mEq/L (ref 19–32)
GFR, Est African American: 52 mL/min — ABNORMAL LOW
Sodium: 136 mEq/L (ref 135–145)
Total Bilirubin: 0.7 mg/dL (ref 0.3–1.2)
Total Protein: 7.5 g/dL (ref 6.0–8.3)

## 2013-01-14 LAB — T-HELPER CELL (CD4) - (RCID CLINIC ONLY): CD4 % Helper T Cell: 25 % — ABNORMAL LOW (ref 33–55)

## 2013-01-22 ENCOUNTER — Other Ambulatory Visit: Payer: Self-pay

## 2013-01-23 ENCOUNTER — Encounter (HOSPITAL_COMMUNITY): Payer: Self-pay | Admitting: Emergency Medicine

## 2013-01-23 ENCOUNTER — Emergency Department (HOSPITAL_COMMUNITY)
Admission: EM | Admit: 2013-01-23 | Discharge: 2013-01-23 | Disposition: A | Discharge status: Home / Self Care | Payer: No Typology Code available for payment source | Attending: Emergency Medicine | Admitting: Emergency Medicine

## 2013-01-23 DIAGNOSIS — Y929 Unspecified place or not applicable: Secondary | ICD-10-CM | POA: Insufficient documentation

## 2013-01-23 DIAGNOSIS — W540XXA Bitten by dog, initial encounter: Secondary | ICD-10-CM | POA: Insufficient documentation

## 2013-01-23 DIAGNOSIS — Y9389 Activity, other specified: Secondary | ICD-10-CM | POA: Insufficient documentation

## 2013-01-23 DIAGNOSIS — Z8639 Personal history of other endocrine, nutritional and metabolic disease: Secondary | ICD-10-CM | POA: Insufficient documentation

## 2013-01-23 DIAGNOSIS — Z8673 Personal history of transient ischemic attack (TIA), and cerebral infarction without residual deficits: Secondary | ICD-10-CM | POA: Insufficient documentation

## 2013-01-23 DIAGNOSIS — Z79899 Other long term (current) drug therapy: Secondary | ICD-10-CM | POA: Insufficient documentation

## 2013-01-23 DIAGNOSIS — Z23 Encounter for immunization: Secondary | ICD-10-CM | POA: Insufficient documentation

## 2013-01-23 DIAGNOSIS — Z794 Long term (current) use of insulin: Secondary | ICD-10-CM | POA: Insufficient documentation

## 2013-01-23 DIAGNOSIS — S61409A Unspecified open wound of unspecified hand, initial encounter: Secondary | ICD-10-CM | POA: Insufficient documentation

## 2013-01-23 DIAGNOSIS — T148XXA Other injury of unspecified body region, initial encounter: Secondary | ICD-10-CM

## 2013-01-23 DIAGNOSIS — Z8669 Personal history of other diseases of the nervous system and sense organs: Secondary | ICD-10-CM | POA: Insufficient documentation

## 2013-01-23 DIAGNOSIS — Z862 Personal history of diseases of the blood and blood-forming organs and certain disorders involving the immune mechanism: Secondary | ICD-10-CM | POA: Insufficient documentation

## 2013-01-23 DIAGNOSIS — E119 Type 2 diabetes mellitus without complications: Secondary | ICD-10-CM | POA: Insufficient documentation

## 2013-01-23 MED ORDER — TETANUS-DIPHTH-ACELL PERTUSSIS 5-2.5-18.5 LF-MCG/0.5 IM SUSP
0.5000 mL | Freq: Once | INTRAMUSCULAR | Status: AC
  Administered 2013-01-23: 0.5 mL via INTRAMUSCULAR
  Filled 2013-01-23: qty 0.5

## 2013-01-23 MED ORDER — AMOXICILLIN-POT CLAVULANATE 500-125 MG PO TABS: 1.0000 | ORAL_TABLET | Freq: Three times a day (TID) | ORAL | Status: DC

## 2013-01-23 NOTE — ED Notes (Signed)
Pt was bitten bit by his own dog yesterday. His dog had just been bitten by a "rabid dog" an hour earlier. Pt's dog is UTD on vaccines. Bite was to right hand. Two small puncture wounds noted on right hand.

## 2013-01-23 NOTE — ED Provider Notes (Signed)
History    CSN: [id number] Arrival date & time 01/23/13  1119  First MD Initiated Contact with Patient 01/23/13 1206     Chief Complaint  Patient presents with  . Animal Bite   (Consider location/radiation/quality/duration/timing/severity/associated sxs/prior Treatment) HPI  Tristan Pugh is a 47 y.o.male with a significant PMH of immune deficiency disorder, CVA, DM, neuropathy presents to the ER with complaints of dog bite to the right hand. His Pomeranian got attacked by a larger dog and when he was taking the dog home his dog bit him in the hand. It was deep enough to bleed but superficial. His dog was up to date on all of its vaccinations. The dog passed away this morning. He comes to the ED to get a tetanus shot. Not having any pain.   Past Medical History  Diagnosis Date  . 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 Immune deficiency disorder   . Diabetes mellitus without complication   . Neuropathy   . Short-term memory loss    Past Surgical History  Procedure Laterality Date  . Foot surgery    . Salivary gland surgery     Family History  Problem Relation Age of Onset  . Stroke Father   . Hypertension Mother   . Hypertension Sister    History  Substance Use Topics  . Smoking status: Never Smoker   . Smokeless tobacco: Never Used  . Alcohol Use: Yes     Comment: occasional    Review of Systems  Review of Systems  Gen: no weight loss, fevers, chills, night sweats  Eyes: no discharge or drainage, no occular pain or visual changes  Nose: no epistaxis or rhinorrhea  Mouth: no dental pain, no sore throat  Neck: no neck pain  Lungs:No wheezing, coughing or hemoptysis CV: no chest pain, palpitations, dependent edema or orthopnea  Abd: no abdominal pain, nausea, vomiting  GU: no dysuria or gross hematuria  MSK:  No abnormalities  Neuro: no headache, no focal neurologic deficits  Skin: + abrasion Psyche: negative.   Allergies  Dapsone;  Prednisone; Trilipix; Ciprofloxacin; and Sulfonamide derivatives  Home Medications   Current Outpatient Rx  Name  Route  Sig  Dispense  Refill  . atazanavir (REYATAZ) 300 MG capsule   Oral   Take 1 capsule (300 mg total) by mouth daily with breakfast.   30 capsule   6   . emtricitabine-tenofovir (TRUVADA) 200-300 MG per tablet   Oral   Take 1 tablet by mouth daily.   30 tablet   6   . glucose blood (ACCU-CHEK AVIVA) test strip      Up to 6 times daily.   100 each   6   . insulin glargine (LANTUS) 100 UNIT/ML injection   Subcutaneous   Inject 18 Units into the skin every morning.          . Multiple Vitamin (ONE-A-DAY MENS PO)   Oral   Take 1 tablet by mouth daily.         . ritonavir (NORVIR) 100 MG TABS   Oral   Take 1 tablet (100 mg total) by mouth daily.   30 tablet   6   . amoxicillin-clavulanate (AUGMENTIN) 500-125 MG per tablet   Oral   Take 1 tablet (500 mg total) by mouth every 8 (eight) hours.   21 tablet   0    BP 115/80  Pulse 69  Temp(Src) 97.8 F (36.6  C) (Oral)  Resp 16  SpO2 100% Physical Exam  Nursing note and vitals reviewed. Constitutional: He appears well-developed and well-nourished. No distress.  HENT:  Head: Normocephalic and atraumatic.  Eyes: Pupils are equal, round, and reactive to light.  Neck: Normal range of motion. Neck supple.  Cardiovascular: Normal rate and regular rhythm.   Pulmonary/Chest: Effort normal.  Abdominal: Soft.  Musculoskeletal:       Hands: Neurological: He is alert.  Skin: Skin is warm and dry.    ED Course  Procedures (including critical care time) Labs Reviewed - No data to display No results found. 1. Animal bite     MDM  Tetanus given in ED. Wound already has begun to heal. Rx: Augmentin  47 y.o.Tristan Pugh's evaluation in the Emergency Department is complete. It has been determined that no acute conditions requiring further emergency intervention are present at this time. The  patient/guardian have been advised of the diagnosis and plan. We have discussed signs and symptoms that warrant return to the ED, such as changes or worsening in symptoms.  Vital signs are stable at discharge. Filed Vitals:   01/23/13 1135  BP: 115/80  Pulse: 69  Temp: 97.8 F (36.6 C)  Resp:     Patient/guardian has voiced understanding and agreed to follow-up with the PCP or specialist.   Dorthula Matas, PA-C 01/23/13 1245

## 2013-01-27 NOTE — ED Provider Notes (Signed)
Medical screening examination/treatment/procedure(s) were performed by non-physician practitioner and as supervising physician I was immediately available for consultation/collaboration.    Gilda Crease, MD 01/27/13 [phone]

## 2013-01-28 ENCOUNTER — Ambulatory Visit: Payer: No Typology Code available for payment source | Admitting: Internal Medicine

## 2013-01-29 ENCOUNTER — Encounter: Payer: Self-pay | Admitting: Internal Medicine

## 2013-01-29 ENCOUNTER — Ambulatory Visit (INDEPENDENT_AMBULATORY_CARE_PROVIDER_SITE_OTHER): Payer: No Typology Code available for payment source | Admitting: Internal Medicine

## 2013-01-29 ENCOUNTER — Other Ambulatory Visit: Payer: Self-pay | Admitting: *Deleted

## 2013-01-29 VITALS — BP 149/84 | HR 54 | Temp 97.8°F | Ht 70.0 in | Wt 193.0 lb

## 2013-01-29 DIAGNOSIS — N183 Chronic kidney disease, stage 3 unspecified: Secondary | ICD-10-CM

## 2013-01-29 DIAGNOSIS — B20 Human immunodeficiency virus [HIV] disease: Secondary | ICD-10-CM

## 2013-01-29 DIAGNOSIS — F528 Other sexual dysfunction not due to a substance or known physiological condition: Secondary | ICD-10-CM

## 2013-01-29 MED ORDER — SILDENAFIL CITRATE 50 MG PO TABS: 50.0000 mg | ORAL_TABLET | Freq: Every day | ORAL | Status: DC | PRN

## 2013-01-29 MED ORDER — ABACAVIR SULFATE-LAMIVUDINE 600-300 MG PO TABS: 1.0000 | ORAL_TABLET | Freq: Every day | ORAL | Status: DC

## 2013-01-29 NOTE — Progress Notes (Signed)
  Subjective:    Patient ID: Tristan Pugh, male    DOB: 1966-04-12, 47 y.o.   MRN: [id number]  HPI He comes in for followup of his HIV. He had labs at his last visit 2 weeks ago and his CD4 count was 620 with an undetectable viral load. He continues to take his Reyataz, Norvir and Truvada. He also has diabetes and chronic renal insufficiency, stage III. His GFR has remained around 50. I did check HLA-B and he is negative. He has had no weight loss. He does get periods of hypoglycemia but otherwise no diarrhea or new issues. He also has continued erectile dysfunction and previously did have success with Viagra. He also did recently have a dog bite from his own dog and is dog is being tested for rabies by the health department.   Review of Systems  Constitutional: Negative for fever, fatigue and unexpected weight change.  HENT: Negative for sore throat and trouble swallowing.   Respiratory: Negative for shortness of breath.   Gastrointestinal: Negative for diarrhea.  Genitourinary: Negative for dysuria and difficulty urinating.  Musculoskeletal: Negative for myalgias and arthralgias.  Skin: Negative for rash.  Neurological: Negative for dizziness, light-headedness and headaches.  Hematological: Negative for adenopathy.  Psychiatric/Behavioral: Negative for dysphoric mood.       Objective:   Physical Exam  Constitutional: He appears well-developed and well-nourished. No distress.  HENT:  Mouth/Throat: No oropharyngeal exudate.  Eyes: No scleral icterus.  Cardiovascular: Normal rate, regular rhythm and normal heart sounds.   No murmur heard. Pulmonary/Chest: Effort normal and breath sounds normal. No respiratory distress. He has no wheezes.  Lymphadenopathy:    He has no cervical adenopathy.  Skin: Skin is warm and dry. No rash noted.  Psychiatric: He has a normal mood and affect.          Assessment & Plan:

## 2013-01-29 NOTE — Assessment & Plan Note (Addendum)
I am going to change his regimen to Epzicom in anticipation of further renal decline in the future. At this time though once a day Epzicom will be sufficient with his current GFR. He will continue with Reyataz and Norvir. He will return in 3 months. He will get the Epzicom with his next refill

## 2013-01-29 NOTE — Assessment & Plan Note (Signed)
His GFR seems to be stable around 50. This will be followed closely and if his GFR remains under 50 his medications will need to be dose adjusted.

## 2013-01-29 NOTE — Assessment & Plan Note (Signed)
I will refill Viagra and he will discuss with patient assistance in regards to getting it. He has no symptoms of angina, chest pain or other concerns.

## 2013-02-04 ENCOUNTER — Telehealth: Payer: Self-pay | Admitting: *Deleted

## 2013-02-04 NOTE — Telephone Encounter (Signed)
Faxed application for Viagra to [facility] today. °

## 2013-02-12 ENCOUNTER — Telehealth: Payer: Self-pay | Admitting: *Deleted

## 2013-02-12 NOTE — Telephone Encounter (Signed)
Called patient to inform him his Viagra #30 was here for pick up.  NDC 1610-9604-54, Lot [id number], Exp 04/14/17.

## 2013-03-19 ENCOUNTER — Ambulatory Visit: Payer: No Typology Code available for payment source | Admitting: Family Medicine

## 2013-03-24 ENCOUNTER — Other Ambulatory Visit: Payer: No Typology Code available for payment source

## 2013-04-06 ENCOUNTER — Telehealth: Payer: Self-pay | Admitting: Family Medicine

## 2013-04-06 DIAGNOSIS — E119 Type 2 diabetes mellitus without complications: Secondary | ICD-10-CM

## 2013-04-06 NOTE — Telephone Encounter (Signed)
Pt says he has been told he needs another appt at [facility] [place]. [name] says we have to call and make the referral. "we have to let them know every time he goes back because he has the orange card"

## 2013-04-07 ENCOUNTER — Telehealth: Payer: Self-pay | Admitting: Family Medicine

## 2013-04-07 NOTE — Telephone Encounter (Signed)
Informed patient of orange card processed.still would like ref he voiced understaning.

## 2013-04-07 NOTE — Telephone Encounter (Signed)
[facility]. Pls call patient when referral has been placed.

## 2013-04-07 NOTE — Telephone Encounter (Signed)
Patient was explained how project x work,I did tell him that we will send ref and forms over and to expect a wait.he voiced understanding. Michele Judy, Virgel Bouquet

## 2013-04-20 NOTE — Telephone Encounter (Signed)
Related message ,patient instructed to schedule appointment with Britta Mccreedy to update orange card.patient voiced understanding. Daran Favaro, Virgel Bouquet

## 2013-04-20 NOTE — Telephone Encounter (Signed)
Received message back from [facility].  Pts orange card has expired, and he will need a new one before referral can be processed and pt does not have a scheduled appt with Britta Mccreedy. [place], Maryjo Rochester

## 2013-04-23 ENCOUNTER — Other Ambulatory Visit (INDEPENDENT_AMBULATORY_CARE_PROVIDER_SITE_OTHER): Payer: Self-pay

## 2013-04-23 DIAGNOSIS — B20 Human immunodeficiency virus [HIV] disease: Secondary | ICD-10-CM

## 2013-04-23 LAB — COMPLETE METABOLIC PANEL WITH GFR
ALT: 20 U/L (ref 0–53)
AST: 22 U/L (ref 0–37)
Alkaline Phosphatase: 85 U/L (ref 39–117)
Creat: 1.64 mg/dL — ABNORMAL HIGH (ref 0.50–1.35)
GFR, Est African American: 56 mL/min — ABNORMAL LOW
Sodium: 133 mEq/L — ABNORMAL LOW (ref 135–145)
Total Bilirubin: 0.7 mg/dL (ref 0.3–1.2)
Total Protein: 8.3 g/dL (ref 6.0–8.3)

## 2013-04-23 LAB — CBC WITH DIFFERENTIAL/PLATELET
Basophils Relative: 0 % (ref 0–1)
Eosinophils Absolute: 0.1 10*3/uL (ref 0.0–0.7)
HCT: 43.8 % (ref 39.0–52.0)
Hemoglobin: 14.9 g/dL (ref 13.0–17.0)
Lymphs Abs: 3 10*3/uL (ref 0.7–4.0)
MCH: 29.1 pg (ref 26.0–34.0)
MCHC: 34 g/dL (ref 30.0–36.0)
MCV: 85.5 fL (ref 78.0–100.0)
Monocytes Absolute: 0.4 10*3/uL (ref 0.1–1.0)
Monocytes Relative: 8 % (ref 3–12)
Neutro Abs: 1.6 10*3/uL — ABNORMAL LOW (ref 1.7–7.7)
Neutrophils Relative %: 32 % — ABNORMAL LOW (ref 43–77)
RBC: 5.12 MIL/uL (ref 4.22–5.81)
WBC: 5.1 10*3/uL (ref 4.0–10.5)

## 2013-04-24 LAB — T-HELPER CELL (CD4) - (RCID CLINIC ONLY): CD4 % Helper T Cell: 23 % — ABNORMAL LOW (ref 33–55)

## 2013-04-24 LAB — HIV-1 RNA QUANT-NO REFLEX-BLD: HIV-1 RNA Quant, Log: 1.3 {Log} (ref <1.30)

## 2013-05-01 ENCOUNTER — Ambulatory Visit: Payer: No Typology Code available for payment source | Attending: Internal Medicine

## 2013-05-01 ENCOUNTER — Ambulatory Visit (INDEPENDENT_AMBULATORY_CARE_PROVIDER_SITE_OTHER): Payer: No Typology Code available for payment source | Admitting: Family Medicine

## 2013-05-01 ENCOUNTER — Encounter: Payer: Self-pay | Admitting: Family Medicine

## 2013-05-01 VITALS — BP 149/75 | HR 64 | Temp 98.2°F | Ht 69.5 in | Wt 206.0 lb

## 2013-05-01 DIAGNOSIS — F528 Other sexual dysfunction not due to a substance or known physiological condition: Secondary | ICD-10-CM

## 2013-05-01 DIAGNOSIS — I10 Essential (primary) hypertension: Secondary | ICD-10-CM

## 2013-05-01 DIAGNOSIS — E119 Type 2 diabetes mellitus without complications: Secondary | ICD-10-CM

## 2013-05-01 DIAGNOSIS — M5126 Other intervertebral disc displacement, lumbar region: Secondary | ICD-10-CM

## 2013-05-01 DIAGNOSIS — B20 Human immunodeficiency virus [HIV] disease: Secondary | ICD-10-CM

## 2013-05-01 DIAGNOSIS — E785 Hyperlipidemia, unspecified: Secondary | ICD-10-CM

## 2013-05-01 DIAGNOSIS — M545 Low back pain: Secondary | ICD-10-CM

## 2013-05-01 MED ORDER — TRAMADOL HCL 50 MG PO TABS: 50.0000 mg | ORAL_TABLET | Freq: Three times a day (TID) | ORAL | Status: DC | PRN

## 2013-05-01 MED ORDER — CYCLOBENZAPRINE HCL 10 MG PO TABS: 10.0000 mg | ORAL_TABLET | Freq: Three times a day (TID) | ORAL | Status: DC | PRN

## 2013-05-01 NOTE — Patient Instructions (Signed)
It was nice to see you today.  Congratulations on getting your diabetes under control!  In regards to your back pain, I am giving you a muscle relaxer and a SHORT course of Tramadol for pain.  I do not prescribe chronic narcotics.  You can also take Tylenol on a regular basis (1000 mg three times daily).    If you continue to have worsening pain, I can prescribe other medications but I do not prescribed chronic narcotics.

## 2013-05-03 DIAGNOSIS — G8929 Other chronic pain: Secondary | ICD-10-CM | POA: Insufficient documentation

## 2013-05-03 NOTE — Assessment & Plan Note (Addendum)
Chronic in nature. Patient given tramadol and Flexeril today.  Informed patient that I will NOT be prescribing chronic narcotics. If pain does not improve, will try additional medications and PT.

## 2013-05-03 NOTE — Progress Notes (Signed)
Subjective:     Patient ID: Tristan Pugh, male   DOB: October 24, 1965, 47 y.o.   MRN: [id number]  HPI 47 year old male presents today for evaluation of back pain and DM-2 follow up.  1) Back pain - Patient reports intermittent lower back pain for a "long time"; He has been given Hydrocodone for this in the past. - Recently, he reports a 1 month history of low back pain.  No injury/trauma/fall. - Pain is severe, often 10/10.  No associated LE weakness, numbness/tingling. - He is currently taking nothing for pain.  Pain is exacerbated by movement.  No relieving factors.  2) DM-2 - Patient complaint with Lantus - A1C due today.  Review of Systems Per HPI    Objective:   Physical Exam Exam: General: well appearing, NAD. Cardiovascular: RRR. No murmurs, rubs, or gallops. Respiratory: CTAB. No rales, rhonchi, or wheeze. Back: Very tender to palpation of lumbar spine.  Paraspinal muscle tenderness noted.      Assessment:     See Problem list    Plan:

## 2013-05-03 NOTE — Assessment & Plan Note (Signed)
A1C 6.5 today. Patient congratulated on reaching goal A1C.

## 2013-05-05 ENCOUNTER — Other Ambulatory Visit: Payer: Self-pay | Admitting: *Deleted

## 2013-05-05 DIAGNOSIS — B20 Human immunodeficiency virus [HIV] disease: Secondary | ICD-10-CM

## 2013-05-05 MED ORDER — ATAZANAVIR SULFATE 300 MG PO CAPS: 300.0000 mg | ORAL_CAPSULE | Freq: Every day | ORAL | Status: DC

## 2013-05-05 MED ORDER — ABACAVIR SULFATE-LAMIVUDINE 600-300 MG PO TABS: 1.0000 | ORAL_TABLET | Freq: Every day | ORAL | Status: DC

## 2013-05-05 MED ORDER — RITONAVIR 100 MG PO TABS: 100.0000 mg | ORAL_TABLET | Freq: Every day | ORAL | Status: DC

## 2013-05-07 ENCOUNTER — Ambulatory Visit: Payer: Self-pay | Admitting: Internal Medicine

## 2013-05-07 ENCOUNTER — Telehealth: Payer: Self-pay | Admitting: *Deleted

## 2013-05-07 NOTE — Telephone Encounter (Signed)
Called patient regarding today's no show and rescheduled him for 05/20/13. Tristan Pugh

## 2013-05-20 ENCOUNTER — Encounter: Payer: Self-pay | Admitting: Internal Medicine

## 2013-05-20 ENCOUNTER — Ambulatory Visit (INDEPENDENT_AMBULATORY_CARE_PROVIDER_SITE_OTHER): Payer: No Typology Code available for payment source | Admitting: Internal Medicine

## 2013-05-20 VITALS — BP 172/94 | HR 60 | Temp 97.9°F | Ht 71.0 in | Wt 218.0 lb

## 2013-05-20 DIAGNOSIS — N183 Chronic kidney disease, stage 3 unspecified: Secondary | ICD-10-CM

## 2013-05-20 DIAGNOSIS — Z113 Encounter for screening for infections with a predominantly sexual mode of transmission: Secondary | ICD-10-CM

## 2013-05-20 DIAGNOSIS — Z23 Encounter for immunization: Secondary | ICD-10-CM

## 2013-05-20 DIAGNOSIS — B20 Human immunodeficiency virus [HIV] disease: Secondary | ICD-10-CM

## 2013-05-20 DIAGNOSIS — Z79899 Other long term (current) drug therapy: Secondary | ICD-10-CM

## 2013-05-20 NOTE — Assessment & Plan Note (Signed)
He is doing well on his regimen and will return in about 4 months

## 2013-05-20 NOTE — Progress Notes (Signed)
  Subjective:    Patient ID: Tristan Pugh, male    DOB: 01/08/1966, 47 y.o.   MRN: [id number]  HPI  He comes in for followup of his HIV. 10 he is to take Reyataz, Norvir and he was changed last visit to Epzicom. This was due to his renal insufficiency. He is doing well on the new regimen and continues to have a suppressed virus and a good CD4 count. His creatinine has also improved a small bit. She otherwise complains of back pain which has been a chronic issue and is being managed by his primary doctor. He has had good control of his diabetes and brought his A1c below 7. No new complaints. He has had some weight gain from not being able to exercise. No diarrhea   Review of Systems  Constitutional: Negative for fever, fatigue and unexpected weight change.  HENT: Negative for sore throat and trouble swallowing.   Respiratory: Negative for shortness of breath.   Gastrointestinal: Negative for diarrhea.  Genitourinary: Negative for dysuria and difficulty urinating.  Musculoskeletal: Negative for arthralgias and myalgias.  Skin: Negative for rash.  Neurological: Negative for dizziness, light-headedness and headaches.  Hematological: Negative for adenopathy.  Psychiatric/Behavioral: Negative for dysphoric mood.       Objective:   Physical Exam  Constitutional: He appears well-developed and well-nourished. No distress.  HENT:  Mouth/Throat: No oropharyngeal exudate.  Eyes: No scleral icterus.  Cardiovascular: Normal rate, regular rhythm and normal heart sounds.   No murmur heard. Pulmonary/Chest: Effort normal and breath sounds normal. No respiratory distress. He has no wheezes.  Lymphadenopathy:    He has no cervical adenopathy.  Skin: Skin is warm and dry. No rash noted.  Psychiatric: He has a normal mood and affect.          Assessment & Plan:

## 2013-05-20 NOTE — Assessment & Plan Note (Signed)
His kidney function has remained stable and no changes in his medications needed.

## 2013-05-22 ENCOUNTER — Ambulatory Visit (INDEPENDENT_AMBULATORY_CARE_PROVIDER_SITE_OTHER): Payer: No Typology Code available for payment source | Admitting: Family Medicine

## 2013-05-22 ENCOUNTER — Encounter: Payer: Self-pay | Admitting: Family Medicine

## 2013-05-22 VITALS — BP 178/76 | HR 66 | Temp 97.9°F | Ht 71.0 in | Wt 217.0 lb

## 2013-05-22 DIAGNOSIS — G8929 Other chronic pain: Secondary | ICD-10-CM

## 2013-05-22 DIAGNOSIS — M545 Low back pain: Secondary | ICD-10-CM

## 2013-05-22 MED ORDER — METHOCARBAMOL 750 MG PO TABS: 750.0000 mg | ORAL_TABLET | Freq: Four times a day (QID) | ORAL | Status: DC

## 2013-05-22 NOTE — Assessment & Plan Note (Signed)
Given extensive prior treatment and lack of response to tramadol, flexeril, I am referring him to PM&R for evaluation. Will try Robaxin in lieu of Flexeril.

## 2013-05-22 NOTE — Progress Notes (Signed)
Subjective:     Patient ID: Tristan Pugh, male   DOB: 09-23-1965, 47 y.o.   MRN: [id number]  HPI 47 year old male presents for follow up regarding low back pain.  1) Chronic low back pain - Patient continues to report severe low back pain.  - The tramadol and flexeril that were prescribed have not helped. - He is also taking tylenol which doesn't seem to help either. - He denies any associated LE weakness, numbness, tingling, saddle anesthesia, fecal/urinary incontinence. - Upon further questioning, [name] indicated that he has had several therapies for this in the past: narcotics, injection, PT.  He has also been seen by [facility] for this.   Review of Systems Per HPI    Objective:   Physical Exam Filed Vitals:   05/22/13 1020  BP: 178/76  Pulse: 66  Temp: 97.9 F (36.6 C)   General: well appearing gentleman in NAD. Back: Spinal tenderness and paraspinal muscle tenderness noted (lumbar spine). Decreased ROM especially extension. Neuro: 5/5 LE muscle strength. Normal patellar and achilles reflexes.      Assessment/Plan:     See Problem List

## 2013-06-15 ENCOUNTER — Telehealth: Payer: Self-pay | Admitting: *Deleted

## 2013-06-15 NOTE — Telephone Encounter (Signed)
Re-ordered Mr. Holtsclaw Viagra today.  Should receive within 7-10 business days.

## 2013-06-20 ENCOUNTER — Other Ambulatory Visit: Payer: Self-pay | Admitting: Family Medicine

## 2013-07-21 ENCOUNTER — Other Ambulatory Visit: Payer: Self-pay | Admitting: Internal Medicine

## 2013-08-06 ENCOUNTER — Encounter: Payer: Self-pay | Admitting: Family Medicine

## 2013-08-06 ENCOUNTER — Ambulatory Visit (INDEPENDENT_AMBULATORY_CARE_PROVIDER_SITE_OTHER): Payer: Medicare Other | Admitting: Family Medicine

## 2013-08-06 VITALS — BP 150/99 | HR 88 | Ht 71.0 in | Wt 224.2 lb

## 2013-08-06 DIAGNOSIS — M545 Low back pain, unspecified: Secondary | ICD-10-CM

## 2013-08-06 DIAGNOSIS — G8929 Other chronic pain: Secondary | ICD-10-CM | POA: Diagnosis not present

## 2013-08-06 DIAGNOSIS — Z888 Allergy status to other drugs, medicaments and biological substances status: Secondary | ICD-10-CM | POA: Diagnosis not present

## 2013-08-06 DIAGNOSIS — E785 Hyperlipidemia, unspecified: Secondary | ICD-10-CM | POA: Diagnosis not present

## 2013-08-06 DIAGNOSIS — R21 Rash and other nonspecific skin eruption: Secondary | ICD-10-CM | POA: Diagnosis not present

## 2013-08-06 DIAGNOSIS — L989 Disorder of the skin and subcutaneous tissue, unspecified: Secondary | ICD-10-CM | POA: Diagnosis not present

## 2013-08-06 DIAGNOSIS — Z79899 Other long term (current) drug therapy: Secondary | ICD-10-CM | POA: Diagnosis not present

## 2013-08-06 DIAGNOSIS — N179 Acute kidney failure, unspecified: Secondary | ICD-10-CM | POA: Diagnosis not present

## 2013-08-06 DIAGNOSIS — R748 Abnormal levels of other serum enzymes: Secondary | ICD-10-CM | POA: Diagnosis not present

## 2013-08-06 DIAGNOSIS — B20 Human immunodeficiency virus [HIV] disease: Secondary | ICD-10-CM | POA: Diagnosis not present

## 2013-08-06 DIAGNOSIS — Z113 Encounter for screening for infections with a predominantly sexual mode of transmission: Secondary | ICD-10-CM | POA: Diagnosis not present

## 2013-08-06 DIAGNOSIS — H5460 Unqualified visual loss, one eye, unspecified: Secondary | ICD-10-CM | POA: Diagnosis not present

## 2013-08-06 DIAGNOSIS — E119 Type 2 diabetes mellitus without complications: Secondary | ICD-10-CM

## 2013-08-06 DIAGNOSIS — M5126 Other intervertebral disc displacement, lumbar region: Secondary | ICD-10-CM | POA: Diagnosis not present

## 2013-08-06 DIAGNOSIS — N183 Chronic kidney disease, stage 3 unspecified: Secondary | ICD-10-CM | POA: Diagnosis not present

## 2013-08-06 DIAGNOSIS — I10 Essential (primary) hypertension: Secondary | ICD-10-CM | POA: Diagnosis not present

## 2013-08-06 DIAGNOSIS — R7401 Elevation of levels of liver transaminase levels: Secondary | ICD-10-CM | POA: Diagnosis not present

## 2013-08-06 DIAGNOSIS — F528 Other sexual dysfunction not due to a substance or known physiological condition: Secondary | ICD-10-CM | POA: Diagnosis not present

## 2013-08-06 DIAGNOSIS — R7402 Elevation of levels of lactic acid dehydrogenase (LDH): Secondary | ICD-10-CM | POA: Diagnosis not present

## 2013-08-06 DIAGNOSIS — R799 Abnormal finding of blood chemistry, unspecified: Secondary | ICD-10-CM | POA: Diagnosis not present

## 2013-08-06 DIAGNOSIS — E875 Hyperkalemia: Secondary | ICD-10-CM | POA: Diagnosis not present

## 2013-08-06 LAB — POCT GLYCOSYLATED HEMOGLOBIN (HGB A1C): Hemoglobin A1C: 8.1

## 2013-08-06 MED ORDER — LISINOPRIL 10 MG PO TABS: 10.0000 mg | ORAL_TABLET | Freq: Every day | ORAL | Status: DC

## 2013-08-06 NOTE — Assessment & Plan Note (Signed)
A1C obtained and was elevated at 8.1 Advised increase in Lantus to 20 units with further titration 1 U daily until fasting sugar ~100.
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
## 2013-08-06 NOTE — Assessment & Plan Note (Signed)
Patient's last 3 pressures have been elevated. In setting of DM-2 and CKD will place on Lisinopril. Follow up BMP in 1 week.

## 2013-08-06 NOTE — Patient Instructions (Signed)
It was nice to see you today.  I am getting an xray of your back and placing a referral for a surgical evaluation.  Your A1C was 8.1 today.  Increase your Lantus to 20 units daily.  Titrate up 1 unit daily until your fasting blood sugar is ~ 100.  I am also placing you on a BP medication.  You can start it today.  Return in 1-2 weeks for blood work.  Follow up in 1-3 months.

## 2013-08-06 NOTE — Assessment & Plan Note (Addendum)
Has not improved with conservative treatment - Tramadol, Muscle relaxants. I have recommended PT but patient refuses stating that he has done this in the past with no improvement.   Also suggested evaluation for facet or epidural injection.  He states that he has had this in the past as well with no relief (no record of this in EPIC). Patient also informed me that he has discussions regarding surgery in the past.  Plan: He currently has no radicular symptoms or red flag symptoms.   Will proceed with Xrays and place referral for surgical evaluation (given refusal of PT and other options).

## 2013-08-07 ENCOUNTER — Ambulatory Visit
Admission: RE | Admit: 2013-08-07 | Discharge: 2013-08-07 | Disposition: A | Discharge status: Home / Self Care | Payer: Medicare Other | Source: Ambulatory Visit | Attending: Family Medicine | Admitting: Family Medicine

## 2013-08-07 DIAGNOSIS — M5137 Other intervertebral disc degeneration, lumbosacral region: Secondary | ICD-10-CM | POA: Diagnosis not present

## 2013-08-07 DIAGNOSIS — E119 Type 2 diabetes mellitus without complications: Secondary | ICD-10-CM

## 2013-08-07 DIAGNOSIS — M47817 Spondylosis without myelopathy or radiculopathy, lumbosacral region: Secondary | ICD-10-CM | POA: Diagnosis not present

## 2013-08-27 DIAGNOSIS — M5126 Other intervertebral disc displacement, lumbar region: Secondary | ICD-10-CM | POA: Diagnosis not present

## 2013-08-27 DIAGNOSIS — M4120 Other idiopathic scoliosis, site unspecified: Secondary | ICD-10-CM | POA: Diagnosis not present

## 2013-09-03 DIAGNOSIS — M5126 Other intervertebral disc displacement, lumbar region: Secondary | ICD-10-CM | POA: Diagnosis not present

## 2013-09-03 DIAGNOSIS — M5137 Other intervertebral disc degeneration, lumbosacral region: Secondary | ICD-10-CM | POA: Diagnosis not present

## 2013-09-03 DIAGNOSIS — M47817 Spondylosis without myelopathy or radiculopathy, lumbosacral region: Secondary | ICD-10-CM | POA: Diagnosis not present

## 2013-09-21 ENCOUNTER — Other Ambulatory Visit: Payer: Self-pay

## 2013-09-28 ENCOUNTER — Other Ambulatory Visit: Payer: Self-pay

## 2013-10-08 ENCOUNTER — Ambulatory Visit (INDEPENDENT_AMBULATORY_CARE_PROVIDER_SITE_OTHER): Payer: Medicare Other | Admitting: Internal Medicine

## 2013-10-08 ENCOUNTER — Encounter: Payer: Self-pay | Admitting: Internal Medicine

## 2013-10-08 VITALS — BP 152/88 | HR 65 | Temp 98.2°F | Ht 71.0 in | Wt 223.0 lb

## 2013-10-08 DIAGNOSIS — Z79899 Other long term (current) drug therapy: Secondary | ICD-10-CM | POA: Insufficient documentation

## 2013-10-08 DIAGNOSIS — B20 Human immunodeficiency virus [HIV] disease: Secondary | ICD-10-CM | POA: Diagnosis not present

## 2013-10-08 DIAGNOSIS — Z113 Encounter for screening for infections with a predominantly sexual mode of transmission: Secondary | ICD-10-CM | POA: Diagnosis not present

## 2013-10-08 LAB — CBC WITH DIFFERENTIAL/PLATELET
BASOS ABS: 0 10*3/uL (ref 0.0–0.1)
Basophils Relative: 1 % (ref 0–1)
EOS ABS: 0 10*3/uL (ref 0.0–0.7)
EOS PCT: 1 % (ref 0–5)
HCT: 40.6 % (ref 39.0–52.0)
Hemoglobin: 13.8 g/dL (ref 13.0–17.0)
LYMPHS PCT: 62 % — AB (ref 12–46)
Lymphs Abs: 2.5 10*3/uL (ref 0.7–4.0)
MCH: 29 pg (ref 26.0–34.0)
MCHC: 34 g/dL (ref 30.0–36.0)
MCV: 85.3 fL (ref 78.0–100.0)
MONO ABS: 0.3 10*3/uL (ref 0.1–1.0)
Monocytes Relative: 8 % (ref 3–12)
Neutro Abs: 1.1 10*3/uL — ABNORMAL LOW (ref 1.7–7.7)
Neutrophils Relative %: 28 % — ABNORMAL LOW (ref 43–77)
Platelets: 246 10*3/uL (ref 150–400)
RBC: 4.76 MIL/uL (ref 4.22–5.81)
RDW: 13.5 % (ref 11.5–15.5)
WBC: 4 10*3/uL (ref 4.0–10.5)

## 2013-10-08 LAB — COMPLETE METABOLIC PANEL WITH GFR
ALBUMIN: 4 g/dL (ref 3.5–5.2)
ALK PHOS: 81 U/L (ref 39–117)
ALT: 17 U/L (ref 0–53)
AST: 21 U/L (ref 0–37)
BUN: 14 mg/dL (ref 6–23)
CALCIUM: 9.2 mg/dL (ref 8.4–10.5)
CHLORIDE: 101 meq/L (ref 96–112)
CO2: 26 mEq/L (ref 19–32)
Creat: 1.8 mg/dL — ABNORMAL HIGH (ref 0.50–1.35)
GFR, Est African American: 50 mL/min — ABNORMAL LOW
GFR, Est Non African American: 43 mL/min — ABNORMAL LOW
Glucose, Bld: 252 mg/dL — ABNORMAL HIGH (ref 70–99)
POTASSIUM: 4.6 meq/L (ref 3.5–5.3)
SODIUM: 135 meq/L (ref 135–145)
TOTAL PROTEIN: 7.5 g/dL (ref 6.0–8.3)
Total Bilirubin: 1.8 mg/dL — ABNORMAL HIGH (ref 0.2–1.2)

## 2013-10-08 MED ORDER — ABACAVIR SULFATE-LAMIVUDINE 600-300 MG PO TABS: 1.0000 | ORAL_TABLET | Freq: Every day | ORAL | Status: DC

## 2013-10-08 MED ORDER — ATAZANAVIR-COBICISTAT 300-150 MG PO TABS: 1.0000 | ORAL_TABLET | Freq: Every day | ORAL | Status: DC

## 2013-10-08 NOTE — Assessment & Plan Note (Signed)
Doing well.  Will check labs today.  If ok, rtc 4 months. Will change Reyataz/r to [employer].

## 2013-10-08 NOTE — Progress Notes (Signed)
   Subjective:    Patient ID: Tristan Pugh, male    DOB: 04/06/1966, 48 y.o.   MRN: [id number]  HPI  Subjective:    Patient ID: Tristan Pugh, male    DOB: 10/16/1965, 48 y.o.   MRN: [id number]  HPI  He comes in for followup of his HIV. He is to take Reyataz, Norvir and Epzicom.  No missed doses.  He is doing well on the new regimen and continues to have a suppressed virus and a good CD4 count. No new complaints. He has had some weight gain from not being able to exercise though now has started up.  Some back pain being managed by PCP.     Review of Systems  Constitutional: Negative for fever, fatigue and unexpected weight change.  HENT: Negative for sore throat and trouble swallowing.   Respiratory: Negative for shortness of breath.   Gastrointestinal: Negative for diarrhea.  Genitourinary: Negative for dysuria and difficulty urinating.  Musculoskeletal: Negative for arthralgias and myalgias.  Skin: Negative for rash.  Neurological: Negative for dizziness, light-headedness and headaches.  Hematological: Negative for adenopathy.  Psychiatric/Behavioral: Negative for dysphoric mood.       Objective:   Physical Exam  Constitutional: He appears well-developed and well-nourished. No distress.  HENT:  Mouth/Throat: No oropharyngeal exudate.  Eyes: No scleral icterus.  Cardiovascular: Normal rate, regular rhythm and normal heart sounds.   No murmur heard. Pulmonary/Chest: Effort normal and breath sounds normal. No respiratory distress. He has no wheezes.  Lymphadenopathy:    He has no cervical adenopathy.  Skin: Skin is warm and dry. No rash noted.  Psychiatric: He has a normal mood and affect.          Assessment & Plan:     Review of Systems     Objective:   Physical Exam        Assessment & Plan:

## 2013-10-09 LAB — HIV-1 RNA QUANT-NO REFLEX-BLD
HIV 1 RNA Quant: <20 copies/mL (ref <20)
HIV-1 RNA Quant, Log: <1.3 {Log} (ref <1.30)

## 2013-10-09 LAB — T-HELPER CELL (CD4) - (RCID CLINIC ONLY)
CD4 % Helper T Cell: 25 % — ABNORMAL LOW (ref 33–55)
CD4 T Cell Abs: 690 /uL (ref 400–2700)

## 2013-10-15 ENCOUNTER — Encounter: Payer: Self-pay | Admitting: Family Medicine

## 2013-10-15 ENCOUNTER — Ambulatory Visit (INDEPENDENT_AMBULATORY_CARE_PROVIDER_SITE_OTHER): Payer: Medicare Other | Admitting: Family Medicine

## 2013-10-15 VITALS — BP 151/93 | HR 61 | Wt 221.3 lb

## 2013-10-15 DIAGNOSIS — F528 Other sexual dysfunction not due to a substance or known physiological condition: Secondary | ICD-10-CM | POA: Diagnosis not present

## 2013-10-15 DIAGNOSIS — M4120 Other idiopathic scoliosis, site unspecified: Secondary | ICD-10-CM | POA: Diagnosis not present

## 2013-10-15 DIAGNOSIS — Z888 Allergy status to other drugs, medicaments and biological substances status: Secondary | ICD-10-CM | POA: Diagnosis not present

## 2013-10-15 DIAGNOSIS — B20 Human immunodeficiency virus [HIV] disease: Secondary | ICD-10-CM | POA: Diagnosis not present

## 2013-10-15 DIAGNOSIS — M4800 Spinal stenosis, site unspecified: Secondary | ICD-10-CM | POA: Diagnosis not present

## 2013-10-15 DIAGNOSIS — R748 Abnormal levels of other serum enzymes: Secondary | ICD-10-CM | POA: Diagnosis not present

## 2013-10-15 DIAGNOSIS — M545 Low back pain, unspecified: Secondary | ICD-10-CM

## 2013-10-15 DIAGNOSIS — R21 Rash and other nonspecific skin eruption: Secondary | ICD-10-CM | POA: Diagnosis not present

## 2013-10-15 DIAGNOSIS — G8929 Other chronic pain: Secondary | ICD-10-CM | POA: Diagnosis not present

## 2013-10-15 DIAGNOSIS — Z113 Encounter for screening for infections with a predominantly sexual mode of transmission: Secondary | ICD-10-CM | POA: Diagnosis not present

## 2013-10-15 DIAGNOSIS — M5126 Other intervertebral disc displacement, lumbar region: Secondary | ICD-10-CM | POA: Diagnosis not present

## 2013-10-15 DIAGNOSIS — L989 Disorder of the skin and subcutaneous tissue, unspecified: Secondary | ICD-10-CM | POA: Diagnosis not present

## 2013-10-15 DIAGNOSIS — N183 Chronic kidney disease, stage 3 unspecified: Secondary | ICD-10-CM | POA: Diagnosis not present

## 2013-10-15 DIAGNOSIS — E119 Type 2 diabetes mellitus without complications: Secondary | ICD-10-CM | POA: Diagnosis not present

## 2013-10-15 DIAGNOSIS — R799 Abnormal finding of blood chemistry, unspecified: Secondary | ICD-10-CM | POA: Diagnosis not present

## 2013-10-15 DIAGNOSIS — E875 Hyperkalemia: Secondary | ICD-10-CM | POA: Diagnosis not present

## 2013-10-15 DIAGNOSIS — Z6831 Body mass index (BMI) 31.0-31.9, adult: Secondary | ICD-10-CM | POA: Diagnosis not present

## 2013-10-15 DIAGNOSIS — N179 Acute kidney failure, unspecified: Secondary | ICD-10-CM | POA: Diagnosis not present

## 2013-10-15 DIAGNOSIS — H5460 Unqualified visual loss, one eye, unspecified: Secondary | ICD-10-CM | POA: Diagnosis not present

## 2013-10-15 DIAGNOSIS — R7401 Elevation of levels of liver transaminase levels: Secondary | ICD-10-CM | POA: Diagnosis not present

## 2013-10-15 DIAGNOSIS — Z79899 Other long term (current) drug therapy: Secondary | ICD-10-CM | POA: Diagnosis not present

## 2013-10-15 DIAGNOSIS — I10 Essential (primary) hypertension: Secondary | ICD-10-CM | POA: Diagnosis not present

## 2013-10-15 DIAGNOSIS — E785 Hyperlipidemia, unspecified: Secondary | ICD-10-CM | POA: Diagnosis not present

## 2013-10-15 NOTE — Assessment & Plan Note (Addendum)
Agree with Dr. Patsey Bertholdook's management and evaluation of his condition.  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  Discussed surgical management vs continued conservative management and pt would like to see a PM&R first to see if we can maximize medication prior to possible surgical intervention.  PM&R referral placed today.  F/U with primary.
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
## 2013-10-15 NOTE — Patient Instructions (Signed)
Nice seeing you today.  We are sending you to the Pain doctor, if you do not hear anything back within 2 weeks, please call.  THanks, Dr. Paulina FusiHess

## 2013-10-16 ENCOUNTER — Other Ambulatory Visit: Payer: Self-pay | Admitting: Internal Medicine

## 2013-10-30 ENCOUNTER — Other Ambulatory Visit: Payer: Self-pay | Admitting: Family Medicine

## 2013-12-11 ENCOUNTER — Other Ambulatory Visit: Payer: Self-pay | Admitting: *Deleted

## 2013-12-11 ENCOUNTER — Telehealth: Payer: Self-pay | Admitting: Family Medicine

## 2013-12-11 NOTE — Telephone Encounter (Signed)
Pt is out of insulin and refills.  Clovis Pu, RN

## 2013-12-11 NOTE — Telephone Encounter (Signed)
Pt called and needs refill on his insulin faxed to the health dept. Myriam Jacobson

## 2013-12-14 MED ORDER — INSULIN GLARGINE 100 UNIT/ML ~~LOC~~ SOLN: 18.0000 [IU] | Freq: Every morning | SUBCUTANEOUS | Status: DC

## 2013-12-14 NOTE — Telephone Encounter (Signed)
Faxed tom [facility].  Clovis Pu, RN

## 2013-12-14 NOTE — Telephone Encounter (Signed)
RN to [employer]

## 2014-01-20 ENCOUNTER — Ambulatory Visit (INDEPENDENT_AMBULATORY_CARE_PROVIDER_SITE_OTHER): Payer: Medicare Other | Admitting: Family Medicine

## 2014-01-20 ENCOUNTER — Encounter: Payer: Self-pay | Admitting: Family Medicine

## 2014-01-20 VITALS — BP 135/71 | HR 69 | Temp 98.1°F | Ht 71.0 in | Wt 214.0 lb

## 2014-01-20 DIAGNOSIS — E119 Type 2 diabetes mellitus without complications: Secondary | ICD-10-CM | POA: Diagnosis not present

## 2014-01-20 DIAGNOSIS — R7401 Elevation of levels of liver transaminase levels: Secondary | ICD-10-CM | POA: Diagnosis not present

## 2014-01-20 DIAGNOSIS — I10 Essential (primary) hypertension: Secondary | ICD-10-CM | POA: Diagnosis not present

## 2014-01-20 DIAGNOSIS — R21 Rash and other nonspecific skin eruption: Secondary | ICD-10-CM | POA: Diagnosis not present

## 2014-01-20 DIAGNOSIS — B20 Human immunodeficiency virus [HIV] disease: Secondary | ICD-10-CM | POA: Diagnosis not present

## 2014-01-20 DIAGNOSIS — Z888 Allergy status to other drugs, medicaments and biological substances status: Secondary | ICD-10-CM | POA: Diagnosis not present

## 2014-01-20 DIAGNOSIS — E785 Hyperlipidemia, unspecified: Secondary | ICD-10-CM | POA: Diagnosis not present

## 2014-01-20 DIAGNOSIS — E663 Overweight: Secondary | ICD-10-CM | POA: Diagnosis not present

## 2014-01-20 DIAGNOSIS — E875 Hyperkalemia: Secondary | ICD-10-CM | POA: Diagnosis not present

## 2014-01-20 DIAGNOSIS — R799 Abnormal finding of blood chemistry, unspecified: Secondary | ICD-10-CM | POA: Diagnosis not present

## 2014-01-20 LAB — POCT GLYCOSYLATED HEMOGLOBIN (HGB A1C): Hemoglobin A1C: 10.4

## 2014-01-20 MED ORDER — SITAGLIPTIN PHOSPHATE 50 MG PO TABS: 50.0000 mg | ORAL_TABLET | Freq: Every day | ORAL | Status: DC

## 2014-01-20 NOTE — Assessment & Plan Note (Signed)
I advised patient to see our nutritionist for diabetes education/weight loss. I informed her that I do not agree with use of Bontril, and particularly in his case given that he is only mildly overweight.

## 2014-01-20 NOTE — Assessment & Plan Note (Signed)
Well-controlled on lisinopril. Will continue.

## 2014-01-20 NOTE — Progress Notes (Signed)
   Subjective:    Patient ID: Tristan Pugh, male    DOB: 04/28/1966, 48 y.o.   MRN: [id number]  HPI 48 year old male with hypertension, hyperlipidemia, uncontrolled diabetes mellitus type 2, HIV, CKD stage III, and chronic low back pain presents for followup.  1) DM-2 Disease Monitoring  Blood Sugar Ranges: Patient states he takes his blood regularly.  He endorses fasting sugars  in the 80's - 90sl  Polyuria: no   Visual problems: no  Medication(s): Lantus 16 units daily Medication Compliance: yes  Medication Side Effects  Hypoglycemia: no   Preventitive Health Care  Eye Exam: Patient in need of eye exam this year.  Diet pattern: Endorses healthy eating with frequent salads and baked foods.  Exercise: Daily walking.  2) HTN Disease Monitoring: Home BP Monitoring - No Medications:  Lisinopril  Compliance -  Yes ROS: Denies chest pain, SOB, lightheadedness/dizziness  3) Overweight - Patient concerned about being overweight - He endorses healthy diet (baked foods, fish, salads with 2 meals a day). - Regarding his activity he endorses daily walking (upwards of 4 miles) - He would like to discuss further options regarding weight loss, and particularly medication called Bontril.  Social Hx - Non smoker.  Review of Systems Per HPI    Objective:   Physical Exam Filed Vitals:   01/20/14 1008  BP: 135/71  Pulse: 69  Temp: 98.1 F (36.7 C)   Exam: General: well appearing, NAD. Cardiovascular: RRR. No murmurs, rubs, or gallops. Respiratory: CTAB. No rales, rhonchi, or wheeze. Abdomen: soft, nontender, nondistended. Extremities: warm, well perfused. No LE edema.  Diabetic Foot Check -  Appearance - no lesions, ulcers or calluses Skin - no unusual pallor or redness  Monofilament testing -  Right - Great toe, medial, central, lateral ball and posterior foot intact Left - Great toe, medial, central, lateral ball and posterior foot intact    Assessment & Plan:  See  Problem list

## 2014-01-20 NOTE — Assessment & Plan Note (Signed)
Despite endorsement of good CBGs at home, A1c was elevated at 10.4 today. Advised titration of Lantus one unit daily to achieve a fasting blood sugar less than 100. Adding Januvia today as patient does not want to start NovoLog again. I also encouraged him to follow up with Dr. Raymondo BandKoval giving poor control.

## 2014-01-20 NOTE — Patient Instructions (Signed)
It was nice to see you today.  Please follow up with our Nutritionist (please call to schedule an appt).  Also, schedule an appt with our pharmacist to discuss diabetes management - Dr. Raymondo BandKoval.  In regards to your current management - Titrate your lantus 1 unit daily until your fasting sugars are below 100.  Please take the new medication as prescribed (Januvia).  Follow up in 3 months.

## 2014-02-02 ENCOUNTER — Other Ambulatory Visit (INDEPENDENT_AMBULATORY_CARE_PROVIDER_SITE_OTHER): Payer: Medicare Other

## 2014-02-02 ENCOUNTER — Other Ambulatory Visit (HOSPITAL_COMMUNITY)
Admission: RE | Admit: 2014-02-02 | Discharge: 2014-02-02 | Disposition: A | Discharge status: Home / Self Care | Payer: Medicare Other | Source: Ambulatory Visit | Attending: Infectious Disease | Admitting: Infectious Disease

## 2014-02-02 DIAGNOSIS — R7401 Elevation of levels of liver transaminase levels: Secondary | ICD-10-CM | POA: Diagnosis not present

## 2014-02-02 DIAGNOSIS — R21 Rash and other nonspecific skin eruption: Secondary | ICD-10-CM | POA: Diagnosis not present

## 2014-02-02 DIAGNOSIS — E119 Type 2 diabetes mellitus without complications: Secondary | ICD-10-CM | POA: Diagnosis not present

## 2014-02-02 DIAGNOSIS — E785 Hyperlipidemia, unspecified: Secondary | ICD-10-CM | POA: Diagnosis not present

## 2014-02-02 DIAGNOSIS — Z113 Encounter for screening for infections with a predominantly sexual mode of transmission: Secondary | ICD-10-CM | POA: Diagnosis not present

## 2014-02-02 DIAGNOSIS — E663 Overweight: Secondary | ICD-10-CM | POA: Diagnosis not present

## 2014-02-02 DIAGNOSIS — Z79899 Other long term (current) drug therapy: Secondary | ICD-10-CM

## 2014-02-02 DIAGNOSIS — Z888 Allergy status to other drugs, medicaments and biological substances status: Secondary | ICD-10-CM | POA: Diagnosis not present

## 2014-02-02 DIAGNOSIS — R799 Abnormal finding of blood chemistry, unspecified: Secondary | ICD-10-CM | POA: Diagnosis not present

## 2014-02-02 DIAGNOSIS — R7402 Elevation of levels of lactic acid dehydrogenase (LDH): Secondary | ICD-10-CM | POA: Diagnosis not present

## 2014-02-02 DIAGNOSIS — B20 Human immunodeficiency virus [HIV] disease: Secondary | ICD-10-CM | POA: Diagnosis not present

## 2014-02-02 DIAGNOSIS — I10 Essential (primary) hypertension: Secondary | ICD-10-CM | POA: Diagnosis not present

## 2014-02-02 DIAGNOSIS — E875 Hyperkalemia: Secondary | ICD-10-CM | POA: Diagnosis not present

## 2014-02-02 LAB — LIPID PANEL
CHOL/HDL RATIO: 4.9 ratio
CHOLESTEROL: 232 mg/dL — AB (ref 0–200)
HDL: 47 mg/dL (ref >39)
LDL Cholesterol: 146 mg/dL — ABNORMAL HIGH (ref 0–99)
TRIGLYCERIDES: 196 mg/dL — AB (ref <150)
VLDL: 39 mg/dL (ref 0–40)

## 2014-02-02 LAB — CBC WITH DIFFERENTIAL/PLATELET
Basophils Absolute: 0 10*3/uL (ref 0.0–0.1)
Basophils Relative: 1 % (ref 0–1)
Eosinophils Absolute: 0.1 10*3/uL (ref 0.0–0.7)
Eosinophils Relative: 2 % (ref 0–5)
HCT: 39.6 % (ref 39.0–52.0)
HEMOGLOBIN: 13.9 g/dL (ref 13.0–17.0)
LYMPHS ABS: 2.2 10*3/uL (ref 0.7–4.0)
Lymphocytes Relative: 52 % — ABNORMAL HIGH (ref 12–46)
MCH: 30.2 pg (ref 26.0–34.0)
MCHC: 35.1 g/dL (ref 30.0–36.0)
MCV: 85.9 fL (ref 78.0–100.0)
MONOS PCT: 8 % (ref 3–12)
Monocytes Absolute: 0.3 10*3/uL (ref 0.1–1.0)
NEUTROS ABS: 1.6 10*3/uL — AB (ref 1.7–7.7)
Neutrophils Relative %: 37 % — ABNORMAL LOW (ref 43–77)
Platelets: 238 10*3/uL (ref 150–400)
RBC: 4.61 MIL/uL (ref 4.22–5.81)
RDW: 14 % (ref 11.5–15.5)
WBC: 4.3 10*3/uL (ref 4.0–10.5)

## 2014-02-02 LAB — COMPLETE METABOLIC PANEL WITH GFR
ALK PHOS: 62 U/L (ref 39–117)
ALT: 13 U/L (ref 0–53)
AST: 19 U/L (ref 0–37)
Albumin: 4 g/dL (ref 3.5–5.2)
BILIRUBIN TOTAL: 1.8 mg/dL — AB (ref 0.2–1.2)
BUN: 19 mg/dL (ref 6–23)
CO2: 25 meq/L (ref 19–32)
CREATININE: 1.57 mg/dL — AB (ref 0.50–1.35)
Calcium: 9.2 mg/dL (ref 8.4–10.5)
Chloride: 102 mEq/L (ref 96–112)
GFR, EST NON AFRICAN AMERICAN: 51 mL/min — AB
GFR, Est African American: 59 mL/min — ABNORMAL LOW
GLUCOSE: 218 mg/dL — AB (ref 70–99)
Potassium: 4.7 mEq/L (ref 3.5–5.3)
Sodium: 137 mEq/L (ref 135–145)
Total Protein: 7.4 g/dL (ref 6.0–8.3)

## 2014-02-02 LAB — RPR TITER: RPR Titer: 1:4 {titer}

## 2014-02-02 LAB — RPR: RPR Ser Ql: REACTIVE — AB

## 2014-02-03 ENCOUNTER — Telehealth: Payer: Self-pay | Admitting: *Deleted

## 2014-02-03 ENCOUNTER — Telehealth: Payer: Self-pay | Admitting: Family Medicine

## 2014-02-03 LAB — FLUORESCENT TREPONEMAL AB(FTA)-IGG-BLD: Fluorescent Treponemal ABS: REACTIVE — AB

## 2014-02-03 LAB — T-HELPER CELL (CD4) - (RCID CLINIC ONLY)
CD4 % Helper T Cell: 21 % — ABNORMAL LOW (ref 33–55)
CD4 T Cell Abs: 440 /uL (ref 400–2700)

## 2014-02-03 NOTE — Telephone Encounter (Signed)
Per Dr Drue SecondSnider called the patient to advised him of a +RPR. Advised the patient the doctor recommended he be treated with 2.4 million dose Bicillian once. The patient advised he has only been active with 1 partner and that was 2 weeks ago prior to that he has not had sex in at least a year. Advised he will come in tomorrow 02/04/14 to be treated.

## 2014-02-03 NOTE — Telephone Encounter (Signed)
Pt called and is having trouble paying for his diabetic supplies. He has found a service where he can get them for free through [facility]. They will be faxing us the information and would like Dr. Adriana Simasook to approve this. If you need to talk to him please call. jw

## 2014-02-04 ENCOUNTER — Ambulatory Visit (INDEPENDENT_AMBULATORY_CARE_PROVIDER_SITE_OTHER): Payer: Medicare Other | Admitting: Licensed Clinical Social Worker

## 2014-02-04 DIAGNOSIS — E785 Hyperlipidemia, unspecified: Secondary | ICD-10-CM | POA: Diagnosis not present

## 2014-02-04 DIAGNOSIS — A539 Syphilis, unspecified: Secondary | ICD-10-CM | POA: Diagnosis not present

## 2014-02-04 DIAGNOSIS — Z888 Allergy status to other drugs, medicaments and biological substances status: Secondary | ICD-10-CM | POA: Diagnosis not present

## 2014-02-04 DIAGNOSIS — R7402 Elevation of levels of lactic acid dehydrogenase (LDH): Secondary | ICD-10-CM | POA: Diagnosis not present

## 2014-02-04 DIAGNOSIS — I10 Essential (primary) hypertension: Secondary | ICD-10-CM | POA: Diagnosis not present

## 2014-02-04 DIAGNOSIS — R799 Abnormal finding of blood chemistry, unspecified: Secondary | ICD-10-CM | POA: Diagnosis not present

## 2014-02-04 DIAGNOSIS — R21 Rash and other nonspecific skin eruption: Secondary | ICD-10-CM | POA: Diagnosis not present

## 2014-02-04 DIAGNOSIS — B20 Human immunodeficiency virus [HIV] disease: Secondary | ICD-10-CM | POA: Diagnosis not present

## 2014-02-04 DIAGNOSIS — R7401 Elevation of levels of liver transaminase levels: Secondary | ICD-10-CM | POA: Diagnosis not present

## 2014-02-04 DIAGNOSIS — E663 Overweight: Secondary | ICD-10-CM | POA: Diagnosis not present

## 2014-02-04 DIAGNOSIS — E875 Hyperkalemia: Secondary | ICD-10-CM | POA: Diagnosis not present

## 2014-02-04 DIAGNOSIS — E119 Type 2 diabetes mellitus without complications: Secondary | ICD-10-CM | POA: Diagnosis not present

## 2014-02-04 MED ORDER — PENICILLIN G BENZATHINE 1200000 UNIT/2ML IM SUSP
1.2000 10*6.[IU] | Freq: Once | INTRAMUSCULAR | Status: AC
  Administered 2014-02-04: 1.2 10*6.[IU] via INTRAMUSCULAR

## 2014-02-06 LAB — HIV-1 RNA QUANT-NO REFLEX-BLD: HIV 1 RNA Quant: <20 copies/mL (ref <20)

## 2014-02-16 ENCOUNTER — Encounter: Payer: Self-pay | Admitting: Internal Medicine

## 2014-02-16 ENCOUNTER — Ambulatory Visit (INDEPENDENT_AMBULATORY_CARE_PROVIDER_SITE_OTHER): Payer: Medicare Other | Admitting: Internal Medicine

## 2014-02-16 VITALS — BP 158/91 | HR 68 | Temp 98.4°F | Wt 213.0 lb

## 2014-02-16 DIAGNOSIS — R7401 Elevation of levels of liver transaminase levels: Secondary | ICD-10-CM | POA: Diagnosis not present

## 2014-02-16 DIAGNOSIS — R799 Abnormal finding of blood chemistry, unspecified: Secondary | ICD-10-CM | POA: Diagnosis not present

## 2014-02-16 DIAGNOSIS — E875 Hyperkalemia: Secondary | ICD-10-CM | POA: Diagnosis not present

## 2014-02-16 DIAGNOSIS — E785 Hyperlipidemia, unspecified: Secondary | ICD-10-CM | POA: Diagnosis not present

## 2014-02-16 DIAGNOSIS — E119 Type 2 diabetes mellitus without complications: Secondary | ICD-10-CM | POA: Diagnosis not present

## 2014-02-16 DIAGNOSIS — I10 Essential (primary) hypertension: Secondary | ICD-10-CM | POA: Diagnosis not present

## 2014-02-16 DIAGNOSIS — Z888 Allergy status to other drugs, medicaments and biological substances status: Secondary | ICD-10-CM | POA: Diagnosis not present

## 2014-02-16 DIAGNOSIS — R21 Rash and other nonspecific skin eruption: Secondary | ICD-10-CM | POA: Diagnosis not present

## 2014-02-16 DIAGNOSIS — B20 Human immunodeficiency virus [HIV] disease: Secondary | ICD-10-CM | POA: Diagnosis not present

## 2014-02-16 MED ORDER — ABACAVIR SULFATE-LAMIVUDINE 600-300 MG PO TABS: 1.0000 | ORAL_TABLET | Freq: Every day | ORAL | Status: DC

## 2014-02-16 MED ORDER — ATAZANAVIR SULFATE 300 MG PO CAPS: 300.0000 mg | ORAL_CAPSULE | Freq: Every day | ORAL | Status: DC

## 2014-02-16 MED ORDER — RITONAVIR 100 MG PO TABS: 100.0000 mg | ORAL_TABLET | Freq: Every day | ORAL | Status: DC

## 2014-02-16 NOTE — Progress Notes (Signed)
  Subjective:    Patient ID: Tristan Pugh, male    DOB: 03/04/1966, 48 y.o.   MRN: [id number]  HPI  He comes in for followup of his HIV. He is to take Reyataz, Norvir and Epzicom.  He is doing well on the new regimen and continues to have a suppressed virus and a good CD4 count. He otherwise complains of back pain which has been a chronic issue and is being managed by his primary doctor. Asks about diet suppressive medicine. No new complaints. He has had some weight gain from not being able to exercise. No diarrhea.     Review of Systems  Constitutional: Negative for fever, fatigue and unexpected weight change.  HENT: Negative for sore throat and trouble swallowing.   Respiratory: Negative for shortness of breath.   Gastrointestinal: Negative for diarrhea.  Genitourinary: Negative for dysuria and difficulty urinating.  Musculoskeletal: Negative for arthralgias and myalgias.  Skin: Negative for rash.  Neurological: Negative for dizziness, light-headedness and headaches.  Hematological: Negative for adenopathy.  Psychiatric/Behavioral: Negative for dysphoric mood.       Objective:   Physical Exam  Constitutional: He appears well-developed and well-nourished. No distress.  HENT:  Mouth/Throat: No oropharyngeal exudate.  Eyes: No scleral icterus.  Cardiovascular: Normal rate, regular rhythm and normal heart sounds.   No murmur heard. Pulmonary/Chest: Effort normal and breath sounds normal. No respiratory distress. He has no wheezes.  Lymphadenopathy:    He has no cervical adenopathy.  Skin: Skin is warm and dry. No rash noted.  Psychiatric: He has a normal mood and affect.          Assessment & Plan:

## 2014-02-16 NOTE — Assessment & Plan Note (Addendum)
Doing well.  Was to start Evotaz but is 25$ copay so will continue with reyataz/norvir with Epzicom. RTC 4 months.

## 2014-02-25 ENCOUNTER — Telehealth: Payer: Self-pay | Admitting: *Deleted

## 2014-02-25 NOTE — Telephone Encounter (Signed)
Pt wanting to know whether he has to speak with a representative from the Health Department.  RN advised pt that the Health Department does follow-up on positive sexually transmitted infections per the [place].  RN advised pt to meet this the representative.  Pt verbalized understanding.

## 2014-04-15 ENCOUNTER — Telehealth: Payer: Self-pay | Admitting: *Deleted

## 2014-04-15 NOTE — Telephone Encounter (Signed)
Pt says he has been waiting over a month to get his diabetic supplies,  [facility] sent us paperwork but has not heard back. Pt just checking status.

## 2014-04-16 NOTE — Telephone Encounter (Signed)
Received the requested paperwork, Dr.Cook has completed. I am faxing out form now.

## 2014-04-16 NOTE — Telephone Encounter (Signed)
Spoke with patient and informed him that we have not received a form. He will call [facility] again and have them fax form back over. i will check this afternoon for this form.

## 2014-06-04 ENCOUNTER — Telehealth: Payer: Self-pay | Admitting: Family Medicine

## 2014-06-04 NOTE — Telephone Encounter (Signed)
Pt called again because [facility] still has not received a prescription from us for his diabetic supplies. He has been waiting for more than a month. He also said the [facility] didn't receive anything either. jw

## 2014-06-04 NOTE — Telephone Encounter (Signed)
I can send in his prescriptions.  Where does he want me to send them to?

## 2014-06-08 ENCOUNTER — Telehealth: Payer: Self-pay | Admitting: *Deleted

## 2014-06-08 NOTE — Telephone Encounter (Signed)
Patient came by office today upset that nothing has been sent to pharmacy, patient leave form where he wants supplies sent, says he wants this done today. Form given to PCP.

## 2014-06-08 NOTE — Telephone Encounter (Signed)
Error

## 2014-09-28 ENCOUNTER — Telehealth: Payer: Self-pay | Admitting: *Deleted

## 2014-09-28 NOTE — Telephone Encounter (Signed)
Called patient and informed him of the need to schedule an appointment for DM management, he will call back to schedule.

## 2014-12-01 ENCOUNTER — Telehealth: Payer: Self-pay | Admitting: *Deleted

## 2014-12-01 ENCOUNTER — Encounter: Payer: Self-pay | Admitting: *Deleted

## 2014-12-01 NOTE — Telephone Encounter (Signed)
Error

## 2014-12-01 NOTE — Telephone Encounter (Signed)
Patient left message in triage, unable to understand the patient.  RN called patient back.  He has lost his insurance, is unable to fill his medication. He has contacted his case worker, she is coming out to help him straighten this out.  He needs an appointment - made one for 6/7 4:15 with Dr. Luciana Axeomer.  He will need to see LyDonia first if he still does not have insurance.  He will get labs the same day.  Patient understands that [employer]ell [employer] and [employer] only cover labs/visits/medications from this office, will not cover his insulin.  Pt stated he understood.  He will call back if he is unable to get his insurance reinstated to speak with Patient Assistance. Andree CossHowell, Lauralye Kinn M, RN

## 2014-12-21 ENCOUNTER — Other Ambulatory Visit (HOSPITAL_COMMUNITY)
Admission: RE | Admit: 2014-12-21 | Discharge: 2014-12-21 | Disposition: A | Discharge status: Home / Self Care | Payer: Medicare HMO | Source: Ambulatory Visit | Attending: Internal Medicine | Admitting: Internal Medicine

## 2014-12-21 ENCOUNTER — Encounter: Payer: Self-pay | Admitting: Internal Medicine

## 2014-12-21 ENCOUNTER — Other Ambulatory Visit: Payer: Self-pay | Admitting: Internal Medicine

## 2014-12-21 ENCOUNTER — Ambulatory Visit (INDEPENDENT_AMBULATORY_CARE_PROVIDER_SITE_OTHER): Payer: 59 | Admitting: Internal Medicine

## 2014-12-21 VITALS — BP 144/89 | HR 65 | Temp 98.9°F | Ht 71.0 in | Wt 225.0 lb

## 2014-12-21 DIAGNOSIS — Z113 Encounter for screening for infections with a predominantly sexual mode of transmission: Secondary | ICD-10-CM | POA: Insufficient documentation

## 2014-12-21 DIAGNOSIS — B20 Human immunodeficiency virus [HIV] disease: Secondary | ICD-10-CM | POA: Diagnosis not present

## 2014-12-21 DIAGNOSIS — Z79899 Other long term (current) drug therapy: Secondary | ICD-10-CM | POA: Diagnosis not present

## 2014-12-21 LAB — COMPLETE METABOLIC PANEL WITH GFR
ALK PHOS: 56 U/L (ref 39–117)
ALT: 17 U/L (ref 0–53)
AST: 22 U/L (ref 0–37)
Albumin: 3.8 g/dL (ref 3.5–5.2)
BUN: 17 mg/dL (ref 6–23)
CO2: 27 meq/L (ref 19–32)
CREATININE: 1.61 mg/dL — AB (ref 0.50–1.35)
Calcium: 8.8 mg/dL (ref 8.4–10.5)
Chloride: 103 mEq/L (ref 96–112)
GFR, Est African American: 57 mL/min — ABNORMAL LOW
GFR, Est Non African American: 49 mL/min — ABNORMAL LOW
Glucose, Bld: 200 mg/dL — ABNORMAL HIGH (ref 70–99)
Potassium: 4.6 mEq/L (ref 3.5–5.3)
Sodium: 137 mEq/L (ref 135–145)
Total Bilirubin: 0.4 mg/dL (ref 0.2–1.2)
Total Protein: 7.7 g/dL (ref 6.0–8.3)

## 2014-12-21 LAB — LIPID PANEL
CHOL/HDL RATIO: 6.1 ratio
Cholesterol: 250 mg/dL — ABNORMAL HIGH (ref 0–200)
HDL: 41 mg/dL (ref >=40)
LDL CALC: 155 mg/dL — AB (ref 0–99)
TRIGLYCERIDES: 271 mg/dL — AB (ref <150)
VLDL: 54 mg/dL — AB (ref 0–40)

## 2014-12-21 MED ORDER — ABACAVIR-DOLUTEGRAVIR-LAMIVUD 600-50-300 MG PO TABS: 1.0000 | ORAL_TABLET | Freq: Every day | ORAL | Status: DC

## 2014-12-21 NOTE — Progress Notes (Signed)
  Subjective:    Patient ID: Tristan Pugh, male    DOB: 07/30/1965, 49 y.o.   MRN: [id number]  HPI He comes in for followup of his HIV. He is to take Reyataz, Norvir and Epzicom.  He is doing well on the new regimen and continues to have a suppressed virus and a good CD4 count. No new complaints.  No diarrhea.  For some reason, got a refill of Truvada, though this was changed to Epzicom 2 years ago.  At first he said he was out of Reyataz and took Truvada with Epzicom, but then said he was on Reyataz.     Review of Systems  Constitutional: Negative for fever, fatigue and unexpected weight change.  HENT: Negative for sore throat and trouble swallowing.   Respiratory: Negative for shortness of breath.   Gastrointestinal: Negative for diarrhea.  Genitourinary: Negative for dysuria and difficulty urinating.  Musculoskeletal: Negative for myalgias and arthralgias.  Skin: Negative for rash.  Neurological: Negative for dizziness, light-headedness and headaches.  Hematological: Negative for adenopathy.  Psychiatric/Behavioral: Negative for dysphoric mood.       Objective:   Physical Exam  Constitutional: He appears well-developed and well-nourished. No distress.  HENT:  Mouth/Throat: No oropharyngeal exudate.  Eyes: No scleral icterus.  Cardiovascular: Normal rate, regular rhythm and normal heart sounds.   No murmur heard. Pulmonary/Chest: Effort normal and breath sounds normal. No respiratory distress. He has no wheezes.  Lymphadenopathy:    He has no cervical adenopathy.  Skin: Skin is warm and dry. No rash noted.  Psychiatric: He has a normal mood and affect.          Assessment & Plan:

## 2014-12-21 NOTE — Assessment & Plan Note (Signed)
He seems to be confused on his medications.  He added Truvada to his regimen via his PCP, though has been on Epzicom so would be redundant.  Will change him to a one pill a day regimen with Triumeq for ease of dosing.  Labs today and return in 6 weeks to be sure he is doing well.

## 2014-12-22 ENCOUNTER — Other Ambulatory Visit: Payer: Self-pay | Admitting: *Deleted

## 2014-12-22 LAB — CBC WITH DIFFERENTIAL/PLATELET
BASOS ABS: 0 10*3/uL (ref 0.0–0.1)
BASOS PCT: 0 % (ref 0–1)
EOS PCT: 1 % (ref 0–5)
Eosinophils Absolute: 0 10*3/uL (ref 0.0–0.7)
HCT: 41.6 % (ref 39.0–52.0)
Hemoglobin: 14.1 g/dL (ref 13.0–17.0)
Lymphocytes Relative: 68 % — ABNORMAL HIGH (ref 12–46)
Lymphs Abs: 2.4 10*3/uL (ref 0.7–4.0)
MCH: 29.4 pg (ref 26.0–34.0)
MCHC: 33.9 g/dL (ref 30.0–36.0)
MCV: 86.7 fL (ref 78.0–100.0)
MONO ABS: 0.4 10*3/uL (ref 0.1–1.0)
MONOS PCT: 10 % (ref 3–12)
MPV: 10.3 fL (ref 8.6–12.4)
Neutro Abs: 0.8 10*3/uL — ABNORMAL LOW (ref 1.7–7.7)
Neutrophils Relative %: 21 % — ABNORMAL LOW (ref 43–77)
Platelets: 223 10*3/uL (ref 150–400)
RBC: 4.8 MIL/uL (ref 4.22–5.81)
RDW: 13.6 % (ref 11.5–15.5)
WBC: 3.6 10*3/uL — ABNORMAL LOW (ref 4.0–10.5)

## 2014-12-22 LAB — RPR TITER: RPR Titer: 1:4 {titer}

## 2014-12-22 LAB — FLUORESCENT TREPONEMAL AB(FTA)-IGG-BLD: Fluorescent Treponemal ABS: REACTIVE — AB

## 2014-12-22 LAB — RPR: RPR Ser Ql: REACTIVE — AB

## 2014-12-22 MED ORDER — ABACAVIR-DOLUTEGRAVIR-LAMIVUD 600-50-300 MG PO TABS: 1.0000 | ORAL_TABLET | Freq: Every day | ORAL | Status: DC

## 2014-12-22 NOTE — Progress Notes (Signed)
For SPAP and emergency coverage

## 2014-12-22 NOTE — Telephone Encounter (Signed)
Error

## 2014-12-22 NOTE — Progress Notes (Signed)
Patient ID: Tristan ScaleKenneth R Duby, male   DOB: 07/27/1965, 49 y.o.   MRN: [id number]    [facility] - Pharmacist    HPI: Tristan Pugh is a 49 y.o. male presented to clinic on 6/7 for follow up of HIV management. Patient is supposed to be on Reyataz, Epzicom, and Norvir. Patient states he ran out of Reyataz and got a refill for Truvada instead and stated that's what he has been taking for the past week. Then he states that he has been taking Reyataz too.   Allergies: Allergies  Allergen Reactions  . Dapsone Other (See Comments)    Pt doesn't remember   . Prednisone      rash  . Trilipix [Choline Fenofibrate] Hives  . Ciprofloxacin Rash  . Sulfonamide Derivatives Rash    Vitals:    Past Medical History: Past Medical History  Diagnosis Date  . 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 Immune deficiency disorder   . Diabetes mellitus without complication   . Neuropathy   . Short-term memory loss     Social History: History   Social History  . Marital Status: Single    Spouse Name: N/A  . Number of Children: N/A  . Years of Education: N/A   Social History Main Topics  . Smoking status: Never Smoker   . Smokeless tobacco: Never Used  . Alcohol Use: 0.0 oz/week    0 Standard drinks or equivalent per week     Comment: occasional  . Drug Use: No  . Sexual Activity: Yes     Comment: pt. declined condoms   Other Topics Concern  . None   Social History Narrative    Previous Regimen: Combinations of Truvada, Evotaz, Reyataz, norvir,   Current Regimen: Epzicom Reyataz Norvir  Labs: HIV 1 RNA QUANT (copies/mL)  Date Value  02/02/2014 <20  10/08/2013 <20  04/23/2013 20   CD4 T CELL ABS (/uL)  Date Value  02/02/2014 440  10/08/2013 690  04/23/2013 750   HCV AB (no units)  Date Value  01/23/2011 NEGATIVE    CrCl: Estimated Creatinine Clearance: 66.8 mL/min (by C-G formula based on Cr of 1.61).  Lipids:      Component Value Date/Time   CHOL 250* 12/21/2014 1654   TRIG 271* 12/21/2014 1654   HDL 41 12/21/2014 1654   CHOLHDL 6.1 12/21/2014 1654   VLDL 54* 12/21/2014 1654   LDLCALC 155* 12/21/2014 1654    Assessment: Patient possibly got mixed up on his current regimen by getting Truvada instead of Reyataz refilled. Patient was instructed to stop taking the medications he has and since he keeps getting confused we are going to switch his regimen to the once daily tablet, Triumeq. Patient was counseled on possible side effects and was instructed to take it once a day around the same time. Patient verbalized understanding. Labs were obtained today to assess disease control. Pt was to go and pick the new Rx up from the pharmacy after the appointment.   Recommendations: Stop current regiemen Begin Triumeq once daily RTC in approx 6 weeks for labs F/u appt per Dr. Fredderick Erbomer  Phylliss Strege, [place] M, [place]D. Clinical Infectious Disease Pharmacist [facility] 12/22/2014, 8:42 AM

## 2014-12-23 ENCOUNTER — Telehealth: Payer: Self-pay | Admitting: *Deleted

## 2014-12-23 DIAGNOSIS — B20 Human immunodeficiency virus [HIV] disease: Secondary | ICD-10-CM

## 2014-12-23 LAB — URINE CYTOLOGY ANCILLARY ONLY
CHLAMYDIA, DNA PROBE: NEGATIVE
NEISSERIA GONORRHEA: NEGATIVE

## 2014-12-23 LAB — HIV-1 RNA QUANT-NO REFLEX-BLD
HIV 1 RNA QUANT: 5860 {copies}/mL — AB (ref <20)
HIV-1 RNA Quant, Log: 3.77 {Log} — ABNORMAL HIGH (ref <1.30)

## 2014-12-23 LAB — T-HELPER CELL (CD4) - (RCID CLINIC ONLY)
CD4 T CELL HELPER: 22 % — AB (ref 33–55)
CD4 T Cell Abs: 530 /uL (ref 400–2700)

## 2014-12-23 NOTE — Telephone Encounter (Signed)
PA sent electronically through CoverMyMeds for Triumeq to [facility].  Pt's [employer] Medicare number is [id number].  Response from [facility] Medicare:  The request has received a Pending outcome. Please note any additional information provided by [facility] at the bottom of your screen.  You will receive a final determination electronically in CoverMyMeds and via email and fax within 24 to 72 hours.  Notified pt that PA completed, may take 3 working days for response.  Pt verbalized understanding.

## 2014-12-24 ENCOUNTER — Telehealth: Payer: Self-pay | Admitting: *Deleted

## 2014-12-24 MED ORDER — ABACAVIR-DOLUTEGRAVIR-LAMIVUD 600-50-300 MG PO TABS: 1.0000 | ORAL_TABLET | Freq: Every day | ORAL | Status: DC

## 2014-12-24 NOTE — Addendum Note (Signed)
Addended by: Jennet Maduro D on: 12/24/2014 09:53 AM   Modules accepted: Orders

## 2014-12-24 NOTE — Telephone Encounter (Signed)
PA approved by [facility].  Pt informed.

## 2014-12-24 NOTE — Telephone Encounter (Signed)
-----   Message from Gardiner Barefoot, MD sent at 12/24/2014  9:28 AM EDT ----- Please add a genotype to his viral load. Just a regular one, don't need the integrase. thanks

## 2014-12-24 NOTE — Telephone Encounter (Signed)
Added Genotype to labs per Dr. Luciana Axe

## 2014-12-24 NOTE — Addendum Note (Signed)
Addended by: Jennet Maduro D on: 12/24/2014 09:14 AM   Modules accepted: Orders

## 2015-01-05 ENCOUNTER — Other Ambulatory Visit: Payer: Self-pay | Admitting: *Deleted

## 2015-01-05 ENCOUNTER — Telehealth: Payer: Self-pay | Admitting: *Deleted

## 2015-01-05 LAB — HIV-1 GENOTYPR PLUS

## 2015-01-05 MED ORDER — DARUNAVIR-COBICISTAT 800-150 MG PO TABS: 1.0000 | ORAL_TABLET | Freq: Every day | ORAL | Status: DC

## 2015-01-05 NOTE — Telephone Encounter (Signed)
-----   Message from Gardiner Barefoot, MD sent at 01/05/2015  1:33 PM EDT ----- As expected, he does have resistance.  He needs to have PRezcobix added to his Triumeq asap.  He needs to keep appt with me July 12th then and I will recheck labs then.   Thanks

## 2015-01-05 NOTE — Telephone Encounter (Signed)
Patient notified and Rx sent to [facility]. Tristan Pugh  

## 2015-01-25 ENCOUNTER — Ambulatory Visit: Payer: 59

## 2015-01-25 ENCOUNTER — Ambulatory Visit (INDEPENDENT_AMBULATORY_CARE_PROVIDER_SITE_OTHER): Payer: 59 | Admitting: Internal Medicine

## 2015-01-25 ENCOUNTER — Encounter: Payer: Self-pay | Admitting: Internal Medicine

## 2015-01-25 VITALS — BP 148/89 | HR 65 | Temp 98.2°F | Wt 222.0 lb

## 2015-01-25 DIAGNOSIS — B20 Human immunodeficiency virus [HIV] disease: Secondary | ICD-10-CM

## 2015-01-25 LAB — CBC WITH DIFFERENTIAL/PLATELET
Basophils Absolute: 0 10*3/uL (ref 0.0–0.1)
Basophils Relative: 0 % (ref 0–1)
Eosinophils Absolute: 0 10*3/uL (ref 0.0–0.7)
Eosinophils Relative: 1 % (ref 0–5)
HEMATOCRIT: 42 % (ref 39.0–52.0)
Hemoglobin: 13.8 g/dL (ref 13.0–17.0)
LYMPHS ABS: 2.3 10*3/uL (ref 0.7–4.0)
LYMPHS PCT: 57 % — AB (ref 12–46)
MCH: 28.8 pg (ref 26.0–34.0)
MCHC: 32.9 g/dL (ref 30.0–36.0)
MCV: 87.7 fL (ref 78.0–100.0)
MONO ABS: 0.3 10*3/uL (ref 0.1–1.0)
MPV: 10.4 fL (ref 8.6–12.4)
Monocytes Relative: 8 % (ref 3–12)
Neutro Abs: 1.4 10*3/uL — ABNORMAL LOW (ref 1.7–7.7)
Neutrophils Relative %: 34 % — ABNORMAL LOW (ref 43–77)
Platelets: 224 10*3/uL (ref 150–400)
RBC: 4.79 MIL/uL (ref 4.22–5.81)
RDW: 14 % (ref 11.5–15.5)
WBC: 4 10*3/uL (ref 4.0–10.5)

## 2015-01-25 NOTE — Addendum Note (Signed)
Addended by: Lavone NianMILLNER, SELMA D on: 01/25/2015 04:11 PM   Modules accepted: Orders

## 2015-01-25 NOTE — Assessment & Plan Note (Signed)
Will check his vl today.  Hopefully now back to undetectable.  Tolerating well.  He may need to change his lisinopril and sees his PCP this week.   rtc 3 months unless concerns.

## 2015-01-25 NOTE — Progress Notes (Signed)
  Subjective:    Patient ID: Tristan Pugh, male    DOB: 02/27/1966, 49 y.o.   MRN: [id number]  HPI He comes in for followup of his HIV. He was on Reyataz, Norvir and Epzicom and doing well but for some reason, got a refill of Truvada, though this was changed to Epzicom 2 years ago.  At first he said he was out of Reyataz and took Truvada with Epzicom, but then said he was on Reyataz.  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  I then changed him to Triumeq and Prezcobix and he has taken this daily with no missed doses.  Did take lisinopril with this this am and noted some tingling in his arms and dizziness.  Did not happen prior to taking lisinopril.     Review of Systems  Constitutional: Negative for fever, fatigue and unexpected weight change.  HENT: Negative for sore throat and trouble swallowing.   Respiratory: Negative for shortness of breath.   Gastrointestinal: Negative for diarrhea.  Genitourinary: Negative for dysuria and difficulty urinating.  Musculoskeletal: Negative for myalgias and arthralgias.  Skin: Negative for rash.  Neurological: Negative for dizziness, light-headedness and headaches.  Hematological: Negative for adenopathy.  Psychiatric/Behavioral: Negative for dysphoric mood.       Objective:   Physical Exam  Constitutional: He appears well-developed and well-nourished. No distress.  HENT:  Mouth/Throat: No oropharyngeal exudate.  Eyes: No scleral icterus.  Cardiovascular: Normal rate, regular rhythm and normal heart sounds.   No murmur heard. Pulmonary/Chest: Effort normal and breath sounds normal. No respiratory distress. He has no wheezes.  Lymphadenopathy:    He has no cervical adenopathy.  Skin: Skin is warm and dry. No rash noted.  Psychiatric: He has a normal mood and affect.          Assessment & Plan:

## 2015-01-26 ENCOUNTER — Other Ambulatory Visit: Payer: Self-pay | Admitting: *Deleted

## 2015-01-26 DIAGNOSIS — B20 Human immunodeficiency virus [HIV] disease: Secondary | ICD-10-CM

## 2015-01-26 LAB — COMPLETE METABOLIC PANEL WITH GFR
ALT: 17 U/L (ref 0–53)
AST: 18 U/L (ref 0–37)
Albumin: 4.1 g/dL (ref 3.5–5.2)
Alkaline Phosphatase: 55 U/L (ref 39–117)
BUN: 16 mg/dL (ref 6–23)
CO2: 22 mEq/L (ref 19–32)
Calcium: 9.5 mg/dL (ref 8.4–10.5)
Chloride: 99 mEq/L (ref 96–112)
Creat: 1.93 mg/dL — ABNORMAL HIGH (ref 0.50–1.35)
GFR, Est African American: 46 mL/min — ABNORMAL LOW
GFR, Est Non African American: 39 mL/min — ABNORMAL LOW
Glucose, Bld: 247 mg/dL — ABNORMAL HIGH (ref 70–99)
POTASSIUM: 4.4 meq/L (ref 3.5–5.3)
Sodium: 137 mEq/L (ref 135–145)
Total Bilirubin: 0.6 mg/dL (ref 0.2–1.2)
Total Protein: 7.6 g/dL (ref 6.0–8.3)

## 2015-01-26 LAB — T-HELPER CELL (CD4) - (RCID CLINIC ONLY)
CD4 T CELL ABS: 550 /uL (ref 400–2700)
CD4 T CELL HELPER: 26 % — AB (ref 33–55)

## 2015-01-26 MED ORDER — DARUNAVIR-COBICISTAT 800-150 MG PO TABS: 1.0000 | ORAL_TABLET | Freq: Every day | ORAL | Status: DC

## 2015-01-26 MED ORDER — ABACAVIR-DOLUTEGRAVIR-LAMIVUD 600-50-300 MG PO TABS: 1.0000 | ORAL_TABLET | Freq: Every day | ORAL | Status: DC

## 2015-01-26 NOTE — Telephone Encounter (Signed)
ADAP Application 

## 2015-01-27 ENCOUNTER — Ambulatory Visit: Payer: Medicare Other

## 2015-01-27 LAB — HIV-1 RNA QUANT-NO REFLEX-BLD
HIV 1 RNA Quant: <20 copies/mL (ref <20)
HIV-1 RNA Quant, Log: <1.3 {Log} (ref <1.30)

## 2015-02-10 DIAGNOSIS — Z8673 Personal history of transient ischemic attack (TIA), and cerebral infarction without residual deficits: Secondary | ICD-10-CM | POA: Insufficient documentation

## 2015-02-10 DIAGNOSIS — R42 Dizziness and giddiness: Secondary | ICD-10-CM | POA: Insufficient documentation

## 2015-02-11 DIAGNOSIS — I63342 Cerebral infarction due to thrombosis of left cerebellar artery: Secondary | ICD-10-CM | POA: Insufficient documentation

## 2015-02-12 DIAGNOSIS — E538 Deficiency of other specified B group vitamins: Secondary | ICD-10-CM | POA: Insufficient documentation

## 2015-02-12 DIAGNOSIS — I639 Cerebral infarction, unspecified: Secondary | ICD-10-CM | POA: Insufficient documentation

## 2015-03-11 ENCOUNTER — Telehealth: Payer: Self-pay | Admitting: *Deleted

## 2015-03-11 NOTE — Telephone Encounter (Signed)
Faxed authorizaiton request via cover my meds. Andree Coss, RN

## 2015-03-15 NOTE — Telephone Encounter (Signed)
Received notification from [facility] that the pt does not require prior authorization anymore for the pt's Triumeq.  Notified Pharmacy.

## 2015-03-23 NOTE — Progress Notes (Signed)
Notified [facility] by fax. Murl Golladay M, RN 

## 2015-04-28 ENCOUNTER — Encounter: Payer: Self-pay | Admitting: Internal Medicine

## 2015-04-28 ENCOUNTER — Ambulatory Visit (INDEPENDENT_AMBULATORY_CARE_PROVIDER_SITE_OTHER): Payer: Medicare HMO | Admitting: Internal Medicine

## 2015-04-28 VITALS — BP 127/79 | HR 64 | Temp 97.7°F | Ht 71.0 in | Wt 202.0 lb

## 2015-04-28 DIAGNOSIS — B20 Human immunodeficiency virus [HIV] disease: Secondary | ICD-10-CM | POA: Diagnosis not present

## 2015-04-28 DIAGNOSIS — I10 Essential (primary) hypertension: Secondary | ICD-10-CM | POA: Diagnosis not present

## 2015-04-28 DIAGNOSIS — F528 Other sexual dysfunction not due to a substance or known physiological condition: Secondary | ICD-10-CM

## 2015-04-28 LAB — COMPLETE METABOLIC PANEL WITH GFR
ALBUMIN: 4.4 g/dL (ref 3.6–5.1)
ALK PHOS: 53 U/L (ref 40–115)
ALT: 13 U/L (ref 9–46)
AST: 20 U/L (ref 10–35)
BUN: 31 mg/dL — AB (ref 7–25)
CALCIUM: 9.5 mg/dL (ref 8.6–10.3)
CO2: 24 mmol/L (ref 20–31)
Chloride: 101 mmol/L (ref 98–110)
Creat: 2.02 mg/dL — ABNORMAL HIGH (ref 0.70–1.33)
GFR, EST AFRICAN AMERICAN: 43 mL/min — AB (ref >=60)
GFR, Est Non African American: 37 mL/min — ABNORMAL LOW (ref >=60)
Glucose, Bld: 138 mg/dL — ABNORMAL HIGH (ref 65–99)
POTASSIUM: 4.4 mmol/L (ref 3.5–5.3)
Sodium: 135 mmol/L (ref 135–146)
Total Bilirubin: 0.4 mg/dL (ref 0.2–1.2)
Total Protein: 7.9 g/dL (ref 6.1–8.1)

## 2015-04-28 LAB — LIPID PANEL
CHOL/HDL RATIO: 6.5 ratio — AB (ref <=5.0)
CHOLESTEROL: 255 mg/dL — AB (ref 125–200)
HDL: 39 mg/dL — ABNORMAL LOW (ref >=40)
LDL Cholesterol: 159 mg/dL — ABNORMAL HIGH (ref <130)
TRIGLYCERIDES: 286 mg/dL — AB (ref <150)
VLDL: 57 mg/dL — AB (ref <30)

## 2015-04-28 NOTE — Assessment & Plan Note (Signed)
Seems to be doing well but also will have [facility] referral to emphasize compliance.  Labs today and rtc 4 months unless concerns.

## 2015-04-28 NOTE — Progress Notes (Signed)
  Subjective:    Patient ID: Tristan Pugh, male    DOB: 09/20/1965, 49 y.o.   MRN: [id number]  HPI He comes in for followup of his HIV. He was on Reyataz, Norvir and Epzicom and doing well but for some reason, got a refill of Truvada, though this was changed to Epzicom 2 years ago.  At first he said he was out of Reyataz and took Truvada with Epzicom, but then said he was on Reyataz.  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  I then changed him to Triumeq and Prezcobix and he has taken this daily with no missed doses.  He comes in for follow up, no recent labs.  Says he is on Triumeq and Prezcobix but did not realize that was a permanent change.  Denies any missed doses.    Review of Systems  Constitutional: Negative for fever, fatigue and unexpected weight change.  HENT: Negative for sore throat and trouble swallowing.   Respiratory: Negative for shortness of breath.   Gastrointestinal: Negative for diarrhea.  Genitourinary: Negative for dysuria and difficulty urinating.  Musculoskeletal: Negative for myalgias and arthralgias.  Skin: Negative for rash.  Neurological: Negative for dizziness, light-headedness and headaches.  Hematological: Negative for adenopathy.  Psychiatric/Behavioral: Negative for dysphoric mood.       Objective:   Physical Exam  Constitutional: He appears well-developed and well-nourished. No distress.  HENT:  Mouth/Throat: No oropharyngeal exudate.  Eyes: No scleral icterus.  Cardiovascular: Normal rate, regular rhythm and normal heart sounds.   No murmur heard. Pulmonary/Chest: Effort normal and breath sounds normal. No respiratory distress. He has no wheezes.  Lymphadenopathy:    He has no cervical adenopathy.  Skin: Skin is warm and dry. No rash noted.  Psychiatric: He has a normal mood and affect.          Assessment & Plan:

## 2015-04-28 NOTE — Patient Instructions (Signed)
Keep taking Prezcobix and Triumeq

## 2015-04-28 NOTE — Assessment & Plan Note (Signed)
Will check testosterone and send to his PCP.

## 2015-04-28 NOTE — Progress Notes (Signed)
Patient referred to [facility]. [facility] to [facility]. Andree CossHowell, Michelle M, RN

## 2015-04-29 LAB — T-HELPER CELL (CD4) - (RCID CLINIC ONLY)
CD4 % Helper T Cell: 26 % — ABNORMAL LOW (ref 33–55)
CD4 T Cell Abs: 520 /uL (ref 400–2700)

## 2015-04-29 LAB — HIV-1 RNA QUANT-NO REFLEX-BLD

## 2015-04-29 LAB — TESTOSTERONE: TESTOSTERONE: 559 ng/dL (ref 300–890)

## 2015-04-29 LAB — PSA: PSA: 0.62 ng/mL (ref <=4.00)

## 2015-05-11 IMAGING — CR DG CHEST 2V
2 series · 2 of 2 positions shown · non-contrast
Comparison: 04/05/2010

CLINICAL DATA: Weakness and lethargy

CHEST - 2 VIEW

[w chest pa]
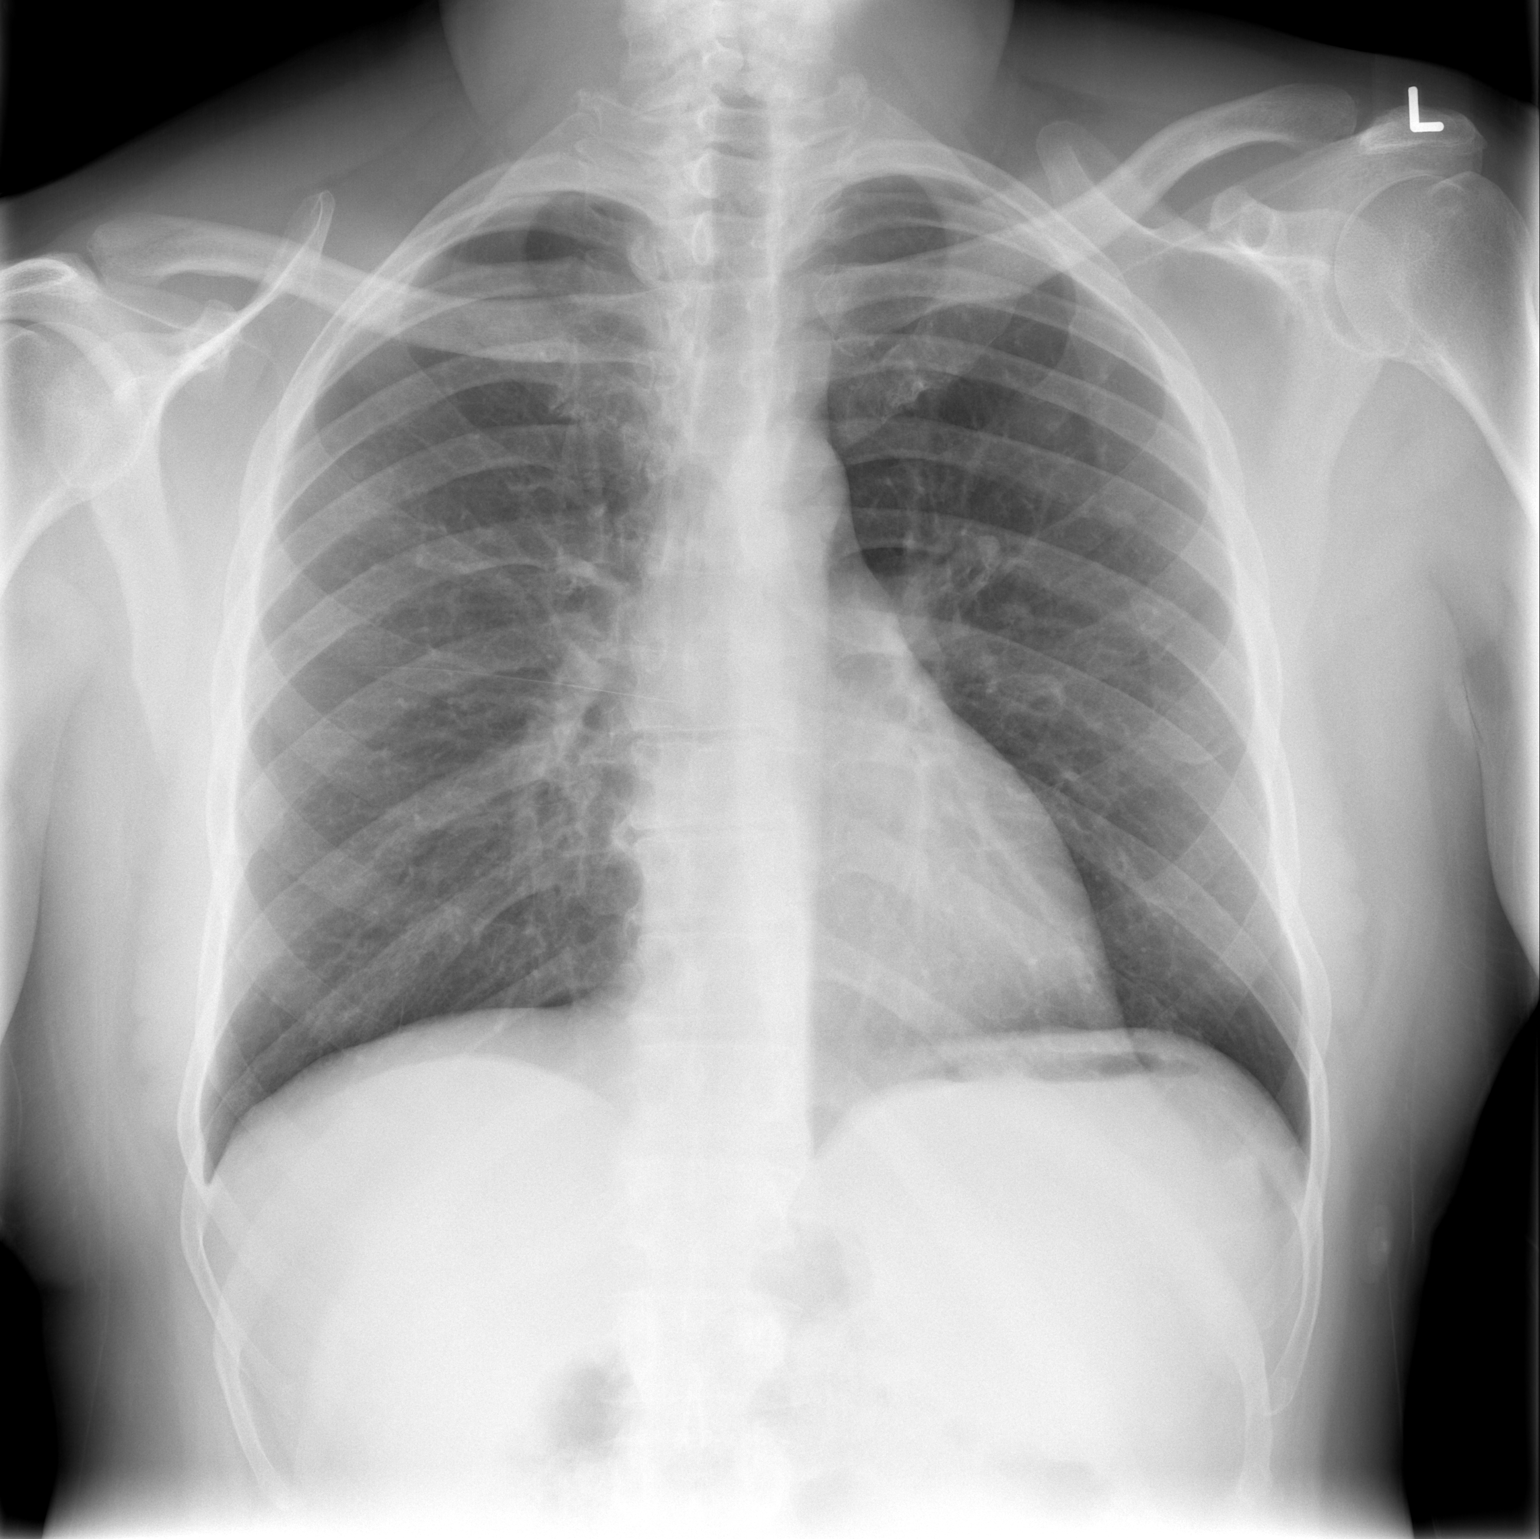

[w chest lat]
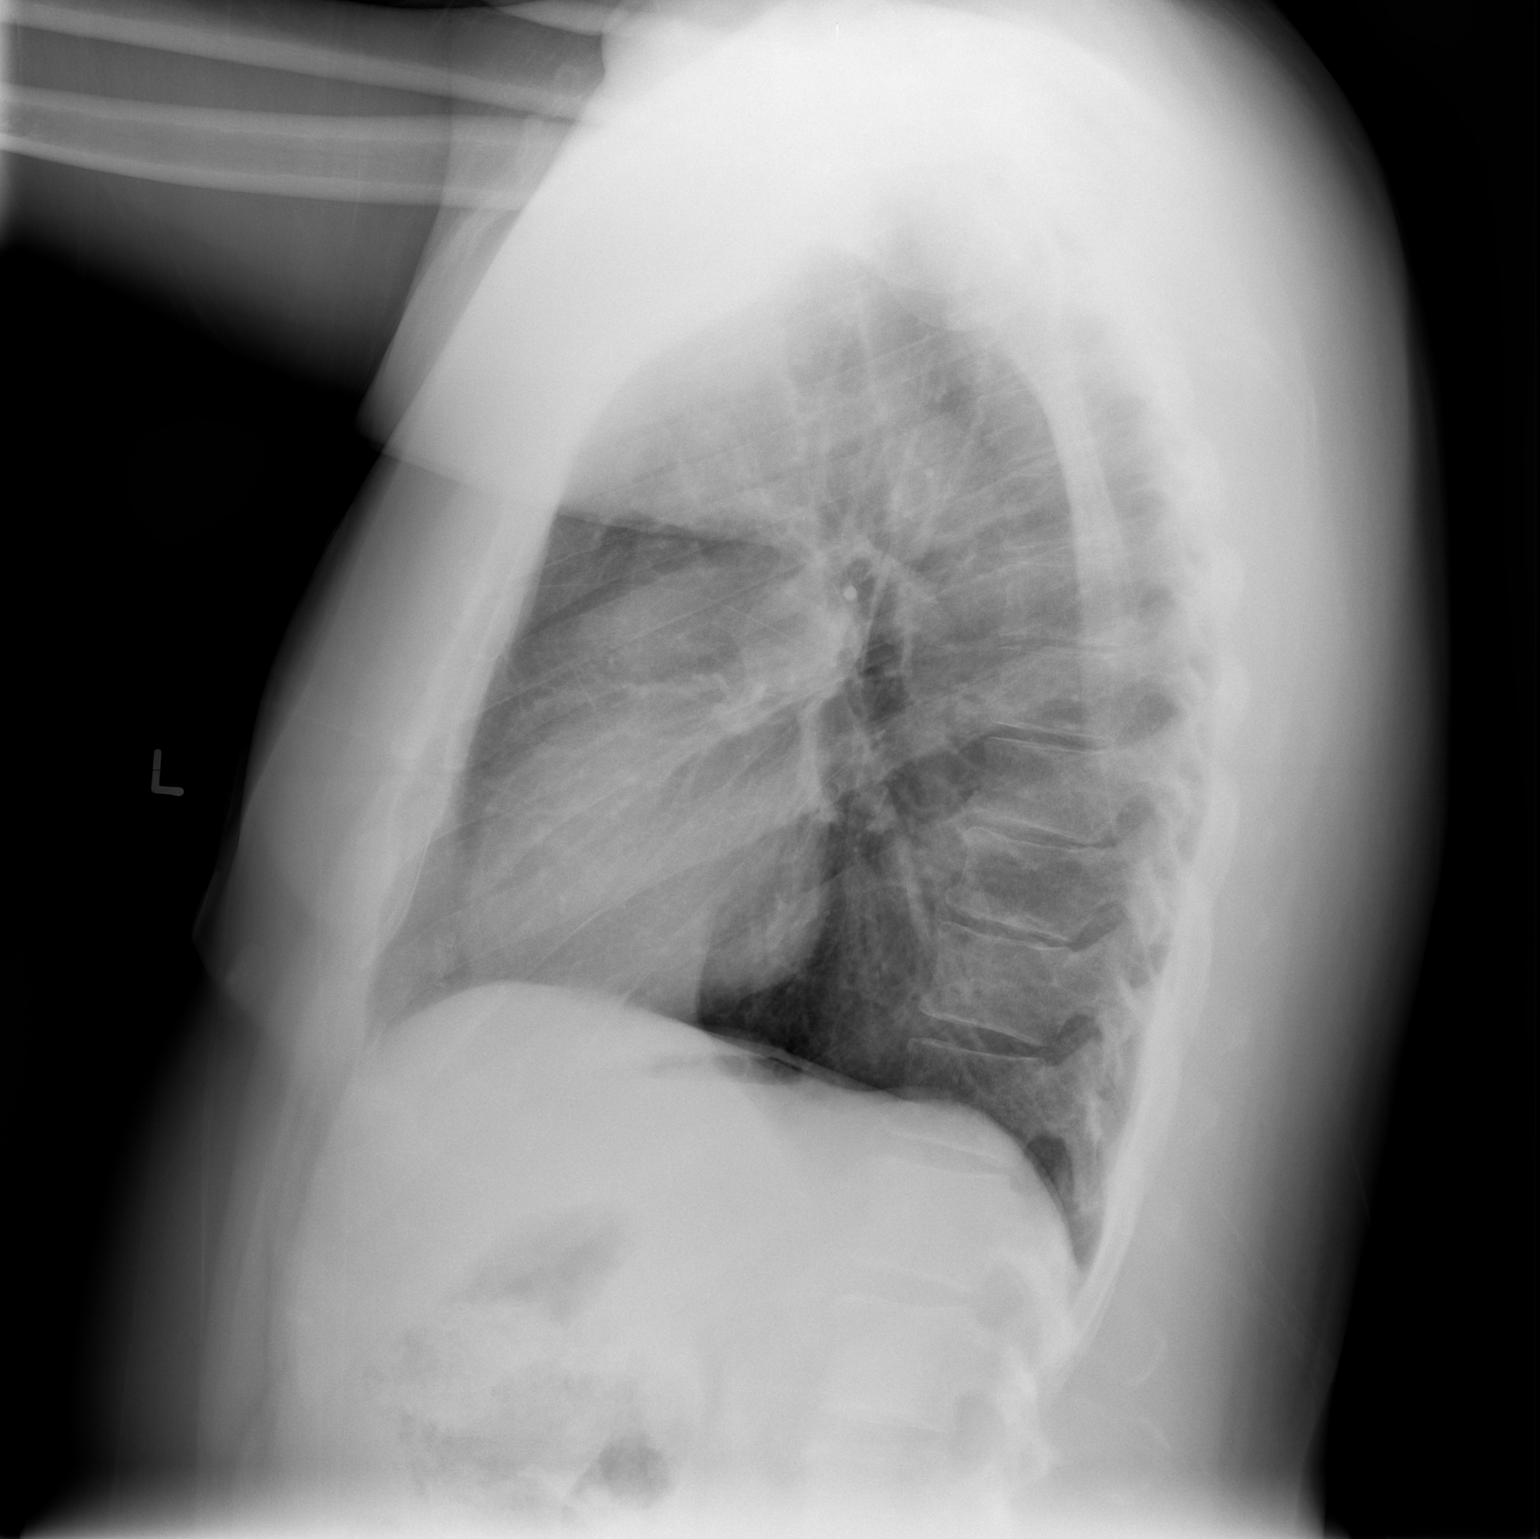

[2 of 2 positions shown; findings below may reference images not displayed]

FINDINGS: The heart size and mediastinal contours are within normal
limits.  Both lungs are clear.  The visualized skeletal structures
are unremarkable.
IMPRESSION: Negative exam.

## 2015-08-06 ENCOUNTER — Other Ambulatory Visit: Payer: Self-pay | Admitting: Internal Medicine

## 2015-08-29 ENCOUNTER — Other Ambulatory Visit: Payer: Self-pay | Admitting: Family Medicine

## 2015-09-24 ENCOUNTER — Encounter (HOSPITAL_COMMUNITY): Payer: Self-pay | Admitting: Emergency Medicine

## 2015-09-24 ENCOUNTER — Emergency Department (INDEPENDENT_AMBULATORY_CARE_PROVIDER_SITE_OTHER)
Admission: EM | Admit: 2015-09-24 | Discharge: 2015-09-24 | Disposition: A | Discharge status: Home / Self Care | Payer: Medicare HMO | Source: Home / Self Care

## 2015-09-24 DIAGNOSIS — J309 Allergic rhinitis, unspecified: Secondary | ICD-10-CM

## 2015-09-24 DIAGNOSIS — R0981 Nasal congestion: Secondary | ICD-10-CM

## 2015-09-24 MED ORDER — AMOXICILLIN 500 MG PO CAPS: 500.0000 mg | ORAL_CAPSULE | Freq: Three times a day (TID) | ORAL | Status: DC

## 2015-09-24 MED ORDER — CETIRIZINE-PSEUDOEPHEDRINE ER 5-120 MG PO TB12: 1.0000 | ORAL_TABLET | Freq: Every day | ORAL | Status: DC

## 2015-09-24 MED ORDER — FLUTICASONE PROPIONATE 50 MCG/ACT NA SUSP
2.0000 | Freq: Every day | NASAL | Status: DC
Start: 2015-09-24 — End: 2016-05-31

## 2015-09-24 NOTE — Discharge Instructions (Signed)
You may chose to NOT fill the antibiotic yet.  If you do fill and take, add a daily probiotic to your regimen for 3 months.   A neti pot is a container designed to rinse debris or mucus from your nasal cavity. You might use a neti pot to treat symptoms of nasal allergies, sinus problems or colds. If you choose to make your own saltwater solution, it's important to use bottled water that has been distilled or sterilized. Tap water is acceptable if it's been boiled for several minutes and then left to cool until it is lukewarm. To use the neti pot, tilt your head sideways over the sink and place the spout of the neti pot in the upper nostril. Breathing through your open mouth, gently pour the saltwater solution into your upper nostril so that the liquid drains through the lower nostril. Repeat on the other side. Be sure to rinse the irrigation device after each use with similarly distilled, sterile, previously boiled and cooled, or filtered water and leave open to air dry. Neti pots are often available in pharmacies and health food stores, as well online.    Allergic Rhinitis Allergic rhinitis is when the mucous membranes in the nose respond to allergens. Allergens are particles in the air that cause your body to have an allergic reaction. This causes you to release allergic antibodies. Through a chain of events, these eventually cause you to release histamine into the blood stream. Although meant to protect the body, it is this release of histamine that causes your discomfort, such as frequent sneezing, congestion, and an itchy, runny nose.  CAUSES Seasonal allergic rhinitis (hay fever) is caused by pollen allergens that may come from grasses, trees, and weeds. Year-round allergic rhinitis (perennial allergic rhinitis) is caused by allergens such as house dust mites, pet dander, and mold spores. SYMPTOMS  Nasal stuffiness (congestion).  Itchy, runny nose with sneezing and tearing of the  eyes. DIAGNOSIS Your health care provider can help you determine the allergen or allergens that trigger your symptoms. If you and your health care provider are unable to determine the allergen, skin or blood testing may be used. Your health care provider will diagnose your condition after taking your health history and performing a physical exam. Your health care provider may assess you for other related conditions, such as asthma, pink eye, or an ear infection. TREATMENT Allergic rhinitis does not have a cure, but it can be controlled by:  Medicines that block allergy symptoms. These may include allergy shots, nasal sprays, and oral antihistamines.  Avoiding the allergen. Hay fever may often be treated with antihistamines in pill or nasal spray forms. Antihistamines block the effects of histamine. There are over-the-counter medicines that may help with nasal congestion and swelling around the eyes. Check with your health care provider before taking or giving this medicine. If avoiding the allergen or the medicine prescribed do not work, there are many new medicines your health care provider can prescribe. Stronger medicine may be used if initial measures are ineffective. Desensitizing injections can be used if medicine and avoidance does not work. Desensitization is when a patient is given ongoing shots until the body becomes less sensitive to the allergen. Make sure you follow up with your health care provider if problems continue. HOME CARE INSTRUCTIONS It is not possible to completely avoid allergens, but you can reduce your symptoms by taking steps to limit your exposure to them. It helps to know exactly what you are allergic to  so that you can avoid your specific triggers. SEEK MEDICAL CARE IF:  You have a fever.  You develop a cough that does not stop easily (persistent).  You have shortness of breath.  You start wheezing.  Symptoms interfere with normal daily activities.   This  information is not intended to replace advice given to you by your health care provider. Make sure you discuss any questions you have with your health care provider.   Document Released: 03/27/2001 Document Revised: 07/23/2014 Document Reviewed: 03/09/2013 Elsevier Interactive Patient Education [employer]

## 2015-09-24 NOTE — ED Notes (Signed)
Pt here with c/o possible URI/ Sinus sx's that started 2 weeks Slight cough, congestion, post nasal drip and now sore throat Taking otc cough/colds meds without relief

## 2015-09-24 NOTE — ED Provider Notes (Signed)
CSN: [id number]     Arrival date & time 09/24/15  1343 History   None    Chief Complaint  Patient presents with  . URI  . Sinus Problem  . Sore Throat   (Consider location/radiation/quality/duration/timing/severity/associated sxs/prior Treatment)  HPI   Patient is a 50 year old male presenting today with complaints of congestion and sore throat with sneezing with drainage at night for approximately 2 weeks. Patient has a history of type 2 diabetes which is well controlled with diet and he has been removed from all oral and injectable medications. Patient states that he does have seasonal allergies "every year".  Reports allergy to sulfa.     Past Medical History  Diagnosis Date  . 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 Immune deficiency disorder (HCC)   . Diabetes mellitus without complication (HCC)   . Neuropathy (HCC)   . Short-term memory loss    Past Surgical History  Procedure Laterality Date  . Foot surgery    . Salivary gland surgery     Family History  Problem Relation Age of Onset  . Stroke Father   . Hypertension Mother   . Hypertension Sister    Social History  Substance Use Topics  . Smoking status: Never Smoker   . Smokeless tobacco: Never Used  . Alcohol Use: 0.0 oz/week    0 Standard drinks or equivalent per week     Comment: occasional    Review of Systems  Constitutional: Negative.  Negative for fever, chills and fatigue.  HENT: Positive for congestion, sinus pressure, sneezing and sore throat. Negative for trouble swallowing and voice change.   Eyes: Positive for itching. Negative for redness.  Respiratory: Positive for cough. Negative for shortness of breath and wheezing.   Cardiovascular: Negative.   Gastrointestinal: Negative.  Negative for nausea, vomiting and diarrhea.  Endocrine: Negative.   Genitourinary: Negative.   Musculoskeletal: Negative.  Negative for neck pain and neck stiffness.  Skin: Negative.  Negative for  pallor and rash.  Allergic/Immunologic: Positive for environmental allergies. Negative for food allergies and immunocompromised state.  Neurological: Negative.   Hematological: Negative.   Psychiatric/Behavioral: Negative.     Allergies  Dapsone; Trilipix; Ciprofloxacin; Prednisone; and Sulfonamide derivatives  Home Medications   Prior to Admission medications   Medication Sig Start Date End Date Taking? Authorizing Provider  Abacavir-Dolutegravir-Lamivud (TRIUMEQ) 600-50-300 MG TABS Take 1 tablet by mouth daily. 01/26/15   Gardiner Barefoot, MD  amoxicillin (AMOXIL) 500 MG capsule Take 1 capsule (500 mg total) by mouth 3 (three) times daily. 09/24/15   Servando Salina, NP  atorvastatin (LIPITOR) 40 MG tablet TAKE 1 TABLET BY MOUTH ONCE DAILY Patient not taking: Reported on 04/28/2015 07/21/13   Gardiner Barefoot, MD  cetirizine-pseudoephedrine (ZYRTEC-D) 5-120 MG tablet Take 1 tablet by mouth daily. 09/24/15   Servando Salina, NP  darunavir-cobicistat (PREZCOBIX) 800-150 MG per tablet Take 1 tablet by mouth daily. Swallow whole. Do NOT crush, break or chew tablets. Take with food. 01/26/15   Gardiner Barefoot, MD  fluticasone (FLONASE) 50 MCG/ACT nasal spray Place 2 sprays into both nostrils daily. 09/24/15   Servando Salina, NP  glucose blood (ACCU-CHEK AVIVA) test strip Up to 6 times daily. 11/26/12   Tommie Sams, DO  hydrochlorothiazide (HYDRODIURIL) 25 MG tablet TK 1 T PO DAILY 03/01/15   Historical Provider, MD  ketoconazole (NIZORAL) 2 % cream  10/27/14   Historical Provider, MD  metFORMIN (GLUCOPHAGE)  850 MG tablet Take 850 mg by mouth daily. 04/02/15   Historical Provider, MD  Multiple Vitamin (ONE-A-DAY MENS PO) Take 1 tablet by mouth daily.    Historical Provider, MD  PREZCOBIX 800-150 MG tablet TAKE 1 TABLET BY MOUTH DAILY. SWALLOW WHOLE. DO NOT CRUSH, BREAK OR CHEW TABLETS.TAKE WITH FOOD 08/08/15   Gardiner Barefootobert W Comer, MD  sildenafil (VIAGRA) 50 MG tablet Take 1 tablet (50 mg total) by mouth  daily as needed for erectile dysfunction. 01/29/13   Ginnie SmartJeffrey C Hatcher, MD   Meds Ordered and Administered this Visit  Medications - No data to display  BP 168/73 mmHg  Pulse 58  Temp(Src) 97.9 F (36.6 C) (Oral)  SpO2 100% No data found.   Physical Exam  Constitutional: He appears well-developed and well-nourished. No distress.  HENT:  Head: Normocephalic and atraumatic.  Right Ear: External ear normal.  Left Ear: External ear normal.  Mouth/Throat: Oropharynx is clear and moist.  Bilateral nares are barely patent and swollen and boggy in appearance.  Posterior oropharynx with mucoid discharge present and cobblestoning pattern noted. Edema but no evidence of patches or exudate.  Neck: Normal range of motion. Neck supple.  Negative for nuchal rigidity.  Cardiovascular: Normal rate, regular rhythm, normal heart sounds and intact distal pulses.  Exam reveals no gallop and no friction rub.   No murmur heard. Pulmonary/Chest: Effort normal and breath sounds normal. No respiratory distress. He has no wheezes. He has no rales. He exhibits no tenderness.  No adventitious breath sounds noted.  Lymphadenopathy:    He has no cervical adenopathy.  Skin: Skin is warm and dry. He is not diaphoretic.  Nursing note and vitals reviewed.   ED Course  Procedures (including critical care time)  Labs Review Labs Reviewed - No data to display  Imaging Review No results found.   MDM   1. Allergic rhinitis, unspecified allergic rhinitis type   2. Sinus congestion    Meds ordered this encounter  Medications  . cetirizine-pseudoephedrine (ZYRTEC-D) 5-120 MG tablet    Sig: Take 1 tablet by mouth daily.    Dispense:  15 tablet    Refill:  0  . fluticasone (FLONASE) 50 MCG/ACT nasal spray    Sig: Place 2 sprays into both nostrils daily.    Dispense:  16 g    Refill:  2  . DISCONTD: amoxicillin (AMOXIL) 500 MG capsule    Sig: Take 1 capsule (500 mg total) by mouth 3 (three) times daily.     Dispense:  21 capsule    Refill:  0  . amoxicillin (AMOXIL) 500 MG capsule    Sig: Take 1 capsule (500 mg total) by mouth 3 (three) times daily.    Dispense:  21 capsule    Refill:  0   Discussed watchful waiting with use of antibiotic for sinus congestion. Patient agrees to hold onto prescription and decide whether it is absolutely necessary for yeast. Discussed neti pot in addition the medications for control of symptoms.  The patient verbalizes understanding and agrees to plan of care.        Servando Salinaatherine H Valetta Mulroy, NP 09/24/15 1650

## 2015-09-25 ENCOUNTER — Telehealth (HOSPITAL_COMMUNITY): Payer: Self-pay | Admitting: Emergency Medicine

## 2015-09-25 NOTE — ED Notes (Signed)
Pt called.... States pharmacy did not receive Rx for Amox from yest visit.... Called in to [facility] ([address] in [place], [place]

## 2015-11-05 ENCOUNTER — Other Ambulatory Visit: Payer: Self-pay | Admitting: Internal Medicine

## 2015-11-07 ENCOUNTER — Other Ambulatory Visit: Payer: Self-pay | Admitting: Internal Medicine

## 2015-11-07 ENCOUNTER — Other Ambulatory Visit: Payer: Self-pay | Admitting: *Deleted

## 2015-11-07 DIAGNOSIS — B20 Human immunodeficiency virus [HIV] disease: Secondary | ICD-10-CM

## 2015-11-08 ENCOUNTER — Other Ambulatory Visit: Payer: Self-pay | Admitting: Internal Medicine

## 2015-11-09 ENCOUNTER — Other Ambulatory Visit: Payer: Medicare HMO

## 2015-11-09 DIAGNOSIS — B20 Human immunodeficiency virus [HIV] disease: Secondary | ICD-10-CM

## 2015-11-10 LAB — T-HELPER CELL (CD4) - (RCID CLINIC ONLY)
CD4 T CELL ABS: 670 /uL (ref 400–2700)
CD4 T CELL HELPER: 29 % — AB (ref 33–55)

## 2015-11-10 LAB — HIV-1 RNA QUANT-NO REFLEX-BLD: HIV-1 RNA Quant, Log: <1.3 Log copies/mL (ref <1.30)

## 2015-11-28 ENCOUNTER — Ambulatory Visit (INDEPENDENT_AMBULATORY_CARE_PROVIDER_SITE_OTHER): Payer: Medicare HMO | Admitting: Internal Medicine

## 2015-11-28 VITALS — BP 120/69 | HR 67 | Temp 98.4°F | Wt 217.0 lb

## 2015-11-28 DIAGNOSIS — E663 Overweight: Secondary | ICD-10-CM

## 2015-11-28 DIAGNOSIS — Z113 Encounter for screening for infections with a predominantly sexual mode of transmission: Secondary | ICD-10-CM

## 2015-11-28 DIAGNOSIS — B20 Human immunodeficiency virus [HIV] disease: Secondary | ICD-10-CM

## 2015-11-28 DIAGNOSIS — E785 Hyperlipidemia, unspecified: Secondary | ICD-10-CM

## 2015-11-28 NOTE — Progress Notes (Signed)
  Subjective:    Patient ID: Tristan Pugh, male    DOB: 11/04/1965, 50 y.o.   MRN: [id number]  HPI He comes in for followup of his HIV.  He was on Reyataz, Norvir and Epzicom and doing well but for some reason, got a refill of Truvada, though this was changed to Epzicom 2 years ago.  At first he said he was out of Reyataz and took Truvada with Epzicom, but then said he was on Reyataz.  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  I then changed him to Triumeq and Prezcobix and he has taken this daily with no missed doses.     He continues to do well.  Now seeing a new PCP.  No sexual activity.  Unable to get Viagra.     Review of Systems  Constitutional: Negative for fatigue.  HENT: Negative for sore throat and trouble swallowing.   Gastrointestinal: Negative for diarrhea.  Skin: Negative for rash.  Neurological: Negative for dizziness and light-headedness.  Psychiatric/Behavioral: Negative for dysphoric mood.       Objective:   Physical Exam  Constitutional: He appears well-developed and well-nourished. No distress.  HENT:  Mouth/Throat: No oropharyngeal exudate.  Eyes: No scleral icterus.  Cardiovascular: Normal rate, regular rhythm and normal heart sounds.   No murmur heard. Pulmonary/Chest: Effort normal and breath sounds normal. No respiratory distress. He has no wheezes.  Lymphadenopathy:    He has no cervical adenopathy.  Skin: Skin is warm and dry. No rash noted.  Psychiatric: He has a normal mood and affect.          Assessment & Plan:

## 2015-11-28 NOTE — Assessment & Plan Note (Signed)
Followed by his PCP 

## 2015-11-28 NOTE — Assessment & Plan Note (Signed)
Has remained undetectable on his salvage regimen now about 1 year.  Will continue. RTC 6 months.

## 2015-11-28 NOTE — Assessment & Plan Note (Signed)
Discussed continued exercise and weight loss.

## 2015-12-07 ENCOUNTER — Other Ambulatory Visit: Payer: Self-pay | Admitting: Family Medicine

## 2016-01-12 DIAGNOSIS — E669 Obesity, unspecified: Secondary | ICD-10-CM | POA: Insufficient documentation

## 2016-02-02 ENCOUNTER — Other Ambulatory Visit: Payer: Self-pay | Admitting: Internal Medicine

## 2016-02-02 DIAGNOSIS — B20 Human immunodeficiency virus [HIV] disease: Secondary | ICD-10-CM

## 2016-02-02 MED ORDER — DARUNAVIR-COBICISTAT 800-150 MG PO TABS: ORAL_TABLET | ORAL | Status: DC

## 2016-02-21 DIAGNOSIS — I6522 Occlusion and stenosis of left carotid artery: Secondary | ICD-10-CM | POA: Insufficient documentation

## 2016-03-04 ENCOUNTER — Encounter: Payer: Self-pay | Admitting: Internal Medicine

## 2016-03-05 NOTE — Telephone Encounter (Signed)
error 

## 2016-05-11 ENCOUNTER — Other Ambulatory Visit: Payer: Self-pay | Admitting: Internal Medicine

## 2016-05-11 DIAGNOSIS — B20 Human immunodeficiency virus [HIV] disease: Secondary | ICD-10-CM

## 2016-05-17 ENCOUNTER — Other Ambulatory Visit: Payer: Medicare HMO

## 2016-05-31 ENCOUNTER — Ambulatory Visit (INDEPENDENT_AMBULATORY_CARE_PROVIDER_SITE_OTHER): Payer: Medicare HMO | Admitting: Internal Medicine

## 2016-05-31 ENCOUNTER — Encounter: Payer: Self-pay | Admitting: Internal Medicine

## 2016-05-31 VITALS — BP 148/92 | HR 63 | Temp 98.4°F | Ht 70.5 in | Wt 206.0 lb

## 2016-05-31 DIAGNOSIS — B20 Human immunodeficiency virus [HIV] disease: Secondary | ICD-10-CM | POA: Diagnosis not present

## 2016-05-31 DIAGNOSIS — E785 Hyperlipidemia, unspecified: Secondary | ICD-10-CM

## 2016-05-31 DIAGNOSIS — Z113 Encounter for screening for infections with a predominantly sexual mode of transmission: Secondary | ICD-10-CM | POA: Diagnosis not present

## 2016-05-31 DIAGNOSIS — N183 Chronic kidney disease, stage 3 unspecified: Secondary | ICD-10-CM

## 2016-05-31 LAB — COMPLETE METABOLIC PANEL WITH GFR
ALT: 17 U/L (ref 9–46)
AST: 20 U/L (ref 10–35)
Albumin: 4.6 g/dL (ref 3.6–5.1)
Alkaline Phosphatase: 53 U/L (ref 40–115)
BILIRUBIN TOTAL: 0.5 mg/dL (ref 0.2–1.2)
BUN: 15 mg/dL (ref 7–25)
CO2: 26 mmol/L (ref 20–31)
Calcium: 9.3 mg/dL (ref 8.6–10.3)
Chloride: 100 mmol/L (ref 98–110)
Creat: 1.9 mg/dL — ABNORMAL HIGH (ref 0.70–1.33)
GFR, EST NON AFRICAN AMERICAN: 40 mL/min — AB (ref >=60)
GFR, Est African American: 46 mL/min — ABNORMAL LOW (ref >=60)
GLUCOSE: 226 mg/dL — AB (ref 65–99)
POTASSIUM: 4.3 mmol/L (ref 3.5–5.3)
SODIUM: 135 mmol/L (ref 135–146)
TOTAL PROTEIN: 8.3 g/dL — AB (ref 6.1–8.1)

## 2016-05-31 LAB — CBC WITH DIFFERENTIAL/PLATELET
BASOS ABS: 0 {cells}/uL (ref 0–200)
Basophils Relative: 0 %
EOS ABS: 40 {cells}/uL (ref 15–500)
Eosinophils Relative: 1 %
HEMATOCRIT: 38.6 % (ref 38.5–50.0)
HEMOGLOBIN: 13.1 g/dL — AB (ref 13.2–17.1)
LYMPHS ABS: 2000 {cells}/uL (ref 850–3900)
Lymphocytes Relative: 50 %
MCH: 30.3 pg (ref 27.0–33.0)
MCHC: 33.9 g/dL (ref 32.0–36.0)
MCV: 89.4 fL (ref 80.0–100.0)
MPV: 9.8 fL (ref 7.5–12.5)
Monocytes Absolute: 360 cells/uL (ref 200–950)
Monocytes Relative: 9 %
NEUTROS ABS: 1600 {cells}/uL (ref 1500–7800)
Neutrophils Relative %: 40 %
Platelets: 241 10*3/uL (ref 140–400)
RBC: 4.32 MIL/uL (ref 4.20–5.80)
RDW: 13.9 % (ref 11.0–15.0)
WBC: 4 10*3/uL (ref 3.8–10.8)

## 2016-05-31 NOTE — Assessment & Plan Note (Signed)
Will continue to monitor in case he needs any dose adjustment.

## 2016-05-31 NOTE — Assessment & Plan Note (Signed)
Will defer lipids to his pcp

## 2016-05-31 NOTE — Assessment & Plan Note (Signed)
Doing well with his medicines.  Labs today and rtc 6 months.

## 2016-05-31 NOTE — Progress Notes (Signed)
  Subjective:    Patient ID: Tristan Pugh, male    DOB: 03/05/1966, 50 y.o.   MRN: [id number]  HPI He comes in for followup of his HIV.  He was on Reyataz, Norvir and Epzicom and doing well but for some reason, got a refill of Truvada, though this was changed to Epzicom 2 years ago.  At first he said he was out of Reyataz and took Truvada with Epzicom, but then said he was on Reyataz.  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  I then changed him to Triumeq and Prezcobix and he has taken this daily with no missed doses.     He continues to do well.  Now seeing a new PCP.  No sexual activity.  Unable to get Viagra.  Recent accident and with neck and right arm pain.    Review of Systems  Constitutional: Negative for fatigue.  HENT: Negative for sore throat and trouble swallowing.   Gastrointestinal: Negative for diarrhea.  Skin: Negative for rash.  Neurological: Negative for dizziness and light-headedness.  Psychiatric/Behavioral: Negative for dysphoric mood.       Objective:   Physical Exam  Constitutional: He appears well-developed and well-nourished. No distress.  HENT:  Mouth/Throat: No oropharyngeal exudate.  Eyes: No scleral icterus.  Cardiovascular: Normal rate, regular rhythm and normal heart sounds.   No murmur heard. Pulmonary/Chest: Effort normal and breath sounds normal. No respiratory distress. He has no wheezes.  Lymphadenopathy:    He has no cervical adenopathy.  Skin: Skin is warm and dry. No rash noted.  Psychiatric: He has a normal mood and affect.          Assessment & Plan:

## 2016-06-01 DIAGNOSIS — M5412 Radiculopathy, cervical region: Secondary | ICD-10-CM | POA: Insufficient documentation

## 2016-06-01 DIAGNOSIS — M4802 Spinal stenosis, cervical region: Secondary | ICD-10-CM | POA: Insufficient documentation

## 2016-06-01 DIAGNOSIS — M5030 Other cervical disc degeneration, unspecified cervical region: Secondary | ICD-10-CM | POA: Insufficient documentation

## 2016-06-01 LAB — T-HELPER CELL (CD4) - (RCID CLINIC ONLY)
CD4 % Helper T Cell: 32 % — ABNORMAL LOW (ref 33–55)
CD4 T Cell Abs: 690 /uL (ref 400–2700)

## 2016-06-01 LAB — RPR: RPR Ser Ql: REACTIVE — AB

## 2016-06-01 LAB — RPR TITER

## 2016-06-01 LAB — FLUORESCENT TREPONEMAL AB(FTA)-IGG-BLD: Fluorescent Treponemal ABS: REACTIVE — AB

## 2016-06-04 LAB — HIV-1 RNA QUANT-NO REFLEX-BLD
HIV 1 RNA Quant: 31 copies/mL — ABNORMAL HIGH (ref <20)
HIV-1 RNA QUANT, LOG: 1.49 {Log_copies}/mL — AB (ref <1.30)

## 2016-07-06 ENCOUNTER — Other Ambulatory Visit: Payer: Self-pay | Admitting: Internal Medicine

## 2016-07-06 DIAGNOSIS — B20 Human immunodeficiency virus [HIV] disease: Secondary | ICD-10-CM

## 2016-08-04 ENCOUNTER — Other Ambulatory Visit: Payer: Self-pay | Admitting: Internal Medicine

## 2016-08-04 DIAGNOSIS — B20 Human immunodeficiency virus [HIV] disease: Secondary | ICD-10-CM

## 2016-12-19 ENCOUNTER — Other Ambulatory Visit: Payer: Self-pay | Admitting: Internal Medicine

## 2016-12-19 DIAGNOSIS — B20 Human immunodeficiency virus [HIV] disease: Secondary | ICD-10-CM

## 2017-03-04 ENCOUNTER — Other Ambulatory Visit: Payer: Self-pay | Admitting: Internal Medicine

## 2017-03-04 DIAGNOSIS — B20 Human immunodeficiency virus [HIV] disease: Secondary | ICD-10-CM

## 2017-03-15 ENCOUNTER — Other Ambulatory Visit: Payer: Self-pay | Admitting: Internal Medicine

## 2017-03-15 DIAGNOSIS — B20 Human immunodeficiency virus [HIV] disease: Secondary | ICD-10-CM

## 2017-03-15 NOTE — Telephone Encounter (Signed)
Needs office visit.

## 2017-04-08 ENCOUNTER — Other Ambulatory Visit: Payer: Self-pay | Admitting: *Deleted

## 2017-04-08 ENCOUNTER — Other Ambulatory Visit: Payer: Self-pay | Admitting: Internal Medicine

## 2017-04-08 DIAGNOSIS — B20 Human immunodeficiency virus [HIV] disease: Secondary | ICD-10-CM

## 2017-04-09 ENCOUNTER — Encounter: Payer: Self-pay | Admitting: Internal Medicine

## 2017-04-09 ENCOUNTER — Ambulatory Visit (INDEPENDENT_AMBULATORY_CARE_PROVIDER_SITE_OTHER): Payer: Medicare HMO | Admitting: Internal Medicine

## 2017-04-09 ENCOUNTER — Other Ambulatory Visit: Payer: Self-pay | Admitting: *Deleted

## 2017-04-09 VITALS — BP 118/80 | HR 62 | Temp 98.2°F | Ht 70.5 in | Wt 220.0 lb

## 2017-04-09 DIAGNOSIS — B20 Human immunodeficiency virus [HIV] disease: Secondary | ICD-10-CM | POA: Diagnosis not present

## 2017-04-09 DIAGNOSIS — E785 Hyperlipidemia, unspecified: Secondary | ICD-10-CM

## 2017-04-09 DIAGNOSIS — N183 Chronic kidney disease, stage 3 unspecified: Secondary | ICD-10-CM

## 2017-04-09 DIAGNOSIS — Z23 Encounter for immunization: Secondary | ICD-10-CM | POA: Diagnosis not present

## 2017-04-09 DIAGNOSIS — Z113 Encounter for screening for infections with a predominantly sexual mode of transmission: Secondary | ICD-10-CM | POA: Diagnosis not present

## 2017-04-09 MED ORDER — DARUNAVIR-COBICISTAT 800-150 MG PO TABS: ORAL_TABLET | ORAL | 6 refills | Status: DC

## 2017-04-09 NOTE — Assessment & Plan Note (Signed)
Screen today 

## 2017-04-09 NOTE — Assessment & Plan Note (Signed)
Lipids monitored by PCP.

## 2017-04-09 NOTE — Assessment & Plan Note (Signed)
Creat monitored by his PCP and last check creat 1.99 which is improved from previous.

## 2017-04-09 NOTE — Progress Notes (Signed)
   Subjective:    Patient ID: Tristan Pugh, male    DOB: 02-08-1966, 51 y.o.   MRN: [id number]  HPI Here for follow up of HIV. 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  Denies any missed doses.  No complaints.  Asking about 1 pill a day options.  Much improved diabetes.  A1C down to 8.  No associated n/v/d.  Some ED with inability to maintain erection.  Asks if it could be related to HIV or the medication.  Some purposeful weight loss.     Review of Systems  Constitutional: Negative for fatigue.  Skin: Negative for rash.  Neurological: Negative for dizziness.       Objective:   Physical Exam  Constitutional: He appears well-developed and well-nourished. No distress.  HENT:  Mouth/Throat: No oropharyngeal exudate.  Eyes: No scleral icterus.  Cardiovascular: Normal rate, regular rhythm and normal heart sounds.   No murmur heard. Pulmonary/Chest: Effort normal and breath sounds normal. No respiratory distress.  Lymphadenopathy:    He has no cervical adenopathy.  Skin: No rash noted.   SH: no tobacco       Assessment & Plan:

## 2017-04-09 NOTE — Assessment & Plan Note (Signed)
Doing good on current regimen.  Labs today and rtc 6 months unless concerns

## 2017-04-10 LAB — T-HELPER CELL (CD4) - (RCID CLINIC ONLY)
CD4 T CELL ABS: 590 /uL (ref 400–2700)
CD4 T CELL HELPER: 24 % — AB (ref 33–55)

## 2017-04-10 LAB — FLUORESCENT TREPONEMAL AB(FTA)-IGG-BLD: Fluorescent Treponemal ABS: REACTIVE — AB

## 2017-04-10 LAB — RPR TITER: RPR Titer: 1:2 {titer} — ABNORMAL HIGH

## 2017-04-10 LAB — RPR: RPR: REACTIVE — AB

## 2017-04-11 LAB — HIV-1 RNA QUANT-NO REFLEX-BLD
HIV 1 RNA QUANT: NOT DETECTED {copies}/mL
HIV-1 RNA QUANT, LOG: NOT DETECTED {Log_copies}/mL

## 2017-04-15 ENCOUNTER — Other Ambulatory Visit: Payer: Self-pay | Admitting: Internal Medicine

## 2017-04-15 DIAGNOSIS — B20 Human immunodeficiency virus [HIV] disease: Secondary | ICD-10-CM

## 2017-09-19 ENCOUNTER — Other Ambulatory Visit: Payer: Self-pay | Admitting: *Deleted

## 2017-09-19 DIAGNOSIS — B20 Human immunodeficiency virus [HIV] disease: Secondary | ICD-10-CM

## 2017-09-19 DIAGNOSIS — Z113 Encounter for screening for infections with a predominantly sexual mode of transmission: Secondary | ICD-10-CM

## 2017-09-24 ENCOUNTER — Other Ambulatory Visit: Payer: 59

## 2017-09-24 ENCOUNTER — Other Ambulatory Visit (HOSPITAL_COMMUNITY)
Admission: RE | Admit: 2017-09-24 | Discharge: 2017-09-24 | Disposition: A | Discharge status: Home / Self Care | Payer: 59 | Source: Ambulatory Visit | Attending: Internal Medicine | Admitting: Internal Medicine

## 2017-09-24 DIAGNOSIS — Z113 Encounter for screening for infections with a predominantly sexual mode of transmission: Secondary | ICD-10-CM | POA: Insufficient documentation

## 2017-09-24 DIAGNOSIS — B20 Human immunodeficiency virus [HIV] disease: Secondary | ICD-10-CM

## 2017-09-25 LAB — T-HELPER CELL (CD4) - (RCID CLINIC ONLY)
CD4 % Helper T Cell: 25 % — ABNORMAL LOW (ref 33–55)
CD4 T CELL ABS: 600 /uL (ref 400–2700)

## 2017-09-25 LAB — URINE CYTOLOGY ANCILLARY ONLY
Chlamydia: NEGATIVE
NEISSERIA GONORRHEA: NEGATIVE

## 2017-09-26 LAB — CBC WITH DIFFERENTIAL/PLATELET
BASOS ABS: 19 {cells}/uL (ref 0–200)
Basophils Relative: 0.4 %
Eosinophils Absolute: 62 cells/uL (ref 15–500)
Eosinophils Relative: 1.3 %
HEMATOCRIT: 39.8 % (ref 38.5–50.0)
HEMOGLOBIN: 13.5 g/dL (ref 13.2–17.1)
LYMPHS ABS: 2146 {cells}/uL (ref 850–3900)
MCH: 29.4 pg (ref 27.0–33.0)
MCHC: 33.9 g/dL (ref 32.0–36.0)
MCV: 86.7 fL (ref 80.0–100.0)
MPV: 10.3 fL (ref 7.5–12.5)
Monocytes Relative: 10.7 %
NEUTROS ABS: 2059 {cells}/uL (ref 1500–7800)
NEUTROS PCT: 42.9 %
Platelets: 246 10*3/uL (ref 140–400)
RBC: 4.59 10*6/uL (ref 4.20–5.80)
RDW: 13.4 % (ref 11.0–15.0)
Total Lymphocyte: 44.7 %
WBC mixed population: 514 cells/uL (ref 200–950)
WBC: 4.8 10*3/uL (ref 3.8–10.8)

## 2017-09-26 LAB — COMPLETE METABOLIC PANEL WITH GFR
AG Ratio: 1.2 (calc) (ref 1.0–2.5)
ALBUMIN MSPROF: 4.2 g/dL (ref 3.6–5.1)
ALT: 18 U/L (ref 9–46)
AST: 18 U/L (ref 10–35)
Alkaline phosphatase (APISO): 52 U/L (ref 40–115)
BILIRUBIN TOTAL: 0.4 mg/dL (ref 0.2–1.2)
BUN / CREAT RATIO: 11 (calc) (ref 6–22)
BUN: 24 mg/dL (ref 7–25)
CHLORIDE: 100 mmol/L (ref 98–110)
CO2: 26 mmol/L (ref 20–32)
CREATININE: 2.1 mg/dL — AB (ref 0.70–1.33)
Calcium: 9.4 mg/dL (ref 8.6–10.3)
GFR, Est African American: 40 mL/min/{1.73_m2} — ABNORMAL LOW (ref >=60)
GFR, Est Non African American: 35 mL/min/{1.73_m2} — ABNORMAL LOW (ref >=60)
GLUCOSE: 239 mg/dL — AB (ref 65–99)
Globulin: 3.6 g/dL (calc) (ref 1.9–3.7)
Potassium: 4.7 mmol/L (ref 3.5–5.3)
Sodium: 135 mmol/L (ref 135–146)
TOTAL PROTEIN: 7.8 g/dL (ref 6.1–8.1)

## 2017-09-26 LAB — RPR TITER

## 2017-09-26 LAB — FLUORESCENT TREPONEMAL AB(FTA)-IGG-BLD: Fluorescent Treponemal ABS: REACTIVE — AB

## 2017-09-26 LAB — RPR: RPR Ser Ql: REACTIVE — AB

## 2017-09-26 LAB — HIV-1 RNA QUANT-NO REFLEX-BLD
HIV 1 RNA QUANT: 27 {copies}/mL — AB
HIV-1 RNA QUANT, LOG: 1.43 {Log_copies}/mL — AB

## 2017-10-08 ENCOUNTER — Ambulatory Visit: Payer: Medicare HMO | Admitting: Internal Medicine

## 2017-10-30 ENCOUNTER — Other Ambulatory Visit: Payer: Self-pay | Admitting: Internal Medicine

## 2017-11-14 DIAGNOSIS — N5201 Erectile dysfunction due to arterial insufficiency: Secondary | ICD-10-CM | POA: Insufficient documentation

## 2017-11-21 ENCOUNTER — Other Ambulatory Visit: Payer: Self-pay | Admitting: Internal Medicine

## 2017-11-21 DIAGNOSIS — B20 Human immunodeficiency virus [HIV] disease: Secondary | ICD-10-CM

## 2017-12-05 DIAGNOSIS — I739 Peripheral vascular disease, unspecified: Secondary | ICD-10-CM | POA: Insufficient documentation

## 2017-12-26 ENCOUNTER — Other Ambulatory Visit: Payer: Self-pay | Admitting: Internal Medicine

## 2017-12-26 DIAGNOSIS — B20 Human immunodeficiency virus [HIV] disease: Secondary | ICD-10-CM

## 2017-12-31 ENCOUNTER — Other Ambulatory Visit: Payer: 59

## 2017-12-31 ENCOUNTER — Other Ambulatory Visit: Payer: Self-pay | Admitting: *Deleted

## 2017-12-31 DIAGNOSIS — B20 Human immunodeficiency virus [HIV] disease: Secondary | ICD-10-CM

## 2018-01-02 ENCOUNTER — Other Ambulatory Visit: Payer: 59

## 2018-01-02 DIAGNOSIS — B20 Human immunodeficiency virus [HIV] disease: Secondary | ICD-10-CM

## 2018-01-02 LAB — COMPLETE METABOLIC PANEL WITH GFR
AG RATIO: 1.2 (calc) (ref 1.0–2.5)
ALT: 14 U/L (ref 9–46)
AST: 16 U/L (ref 10–35)
Albumin: 4 g/dL (ref 3.6–5.1)
Alkaline phosphatase (APISO): 54 U/L (ref 40–115)
BUN/Creatinine Ratio: 8 (calc) (ref 6–22)
BUN: 17 mg/dL (ref 7–25)
CALCIUM: 8.8 mg/dL (ref 8.6–10.3)
CO2: 21 mmol/L (ref 20–32)
CREATININE: 2.01 mg/dL — AB (ref 0.70–1.33)
Chloride: 105 mmol/L (ref 98–110)
GFR, EST NON AFRICAN AMERICAN: 37 mL/min/{1.73_m2} — AB (ref >=60)
GFR, Est African American: 43 mL/min/{1.73_m2} — ABNORMAL LOW (ref >=60)
Globulin: 3.3 g/dL (calc) (ref 1.9–3.7)
Glucose, Bld: 206 mg/dL — ABNORMAL HIGH (ref 65–99)
POTASSIUM: 4.2 mmol/L (ref 3.5–5.3)
Sodium: 136 mmol/L (ref 135–146)
Total Bilirubin: 0.4 mg/dL (ref 0.2–1.2)
Total Protein: 7.3 g/dL (ref 6.1–8.1)

## 2018-01-02 LAB — CBC WITH DIFFERENTIAL/PLATELET
BASOS PCT: 0.2 %
Basophils Absolute: 10 cells/uL (ref 0–200)
EOS ABS: 88 {cells}/uL (ref 15–500)
Eosinophils Relative: 1.7 %
HCT: 36.8 % — ABNORMAL LOW (ref 38.5–50.0)
HEMOGLOBIN: 12.5 g/dL — AB (ref 13.2–17.1)
Lymphs Abs: 2283 cells/uL (ref 850–3900)
MCH: 29.3 pg (ref 27.0–33.0)
MCHC: 34 g/dL (ref 32.0–36.0)
MCV: 86.2 fL (ref 80.0–100.0)
MONOS PCT: 7.8 %
MPV: 10.5 fL (ref 7.5–12.5)
Neutro Abs: 2413 cells/uL (ref 1500–7800)
Neutrophils Relative %: 46.4 %
Platelets: 217 10*3/uL (ref 140–400)
RBC: 4.27 10*6/uL (ref 4.20–5.80)
RDW: 13 % (ref 11.0–15.0)
Total Lymphocyte: 43.9 %
WBC mixed population: 406 cells/uL (ref 200–950)
WBC: 5.2 10*3/uL (ref 3.8–10.8)

## 2018-01-03 LAB — T-HELPER CELL (CD4) - (RCID CLINIC ONLY)
CD4 T CELL HELPER: 24 % — AB (ref 33–55)
CD4 T Cell Abs: 620 /uL (ref 400–2700)

## 2018-01-06 LAB — HIV-1 RNA QUANT-NO REFLEX-BLD
HIV 1 RNA Quant: <20 copies/mL
HIV-1 RNA Quant, Log: <1.3 Log copies/mL

## 2018-01-14 ENCOUNTER — Ambulatory Visit (INDEPENDENT_AMBULATORY_CARE_PROVIDER_SITE_OTHER): Payer: 59 | Admitting: Internal Medicine

## 2018-01-14 ENCOUNTER — Encounter: Payer: Self-pay | Admitting: Internal Medicine

## 2018-01-14 VITALS — BP 133/82 | HR 71 | Temp 98.1°F | Ht 70.5 in | Wt 225.0 lb

## 2018-01-14 DIAGNOSIS — N183 Chronic kidney disease, stage 3 unspecified: Secondary | ICD-10-CM

## 2018-01-14 DIAGNOSIS — Z113 Encounter for screening for infections with a predominantly sexual mode of transmission: Secondary | ICD-10-CM

## 2018-01-14 DIAGNOSIS — Z23 Encounter for immunization: Secondary | ICD-10-CM

## 2018-01-14 DIAGNOSIS — B20 Human immunodeficiency virus [HIV] disease: Secondary | ICD-10-CM

## 2018-01-14 DIAGNOSIS — Z7189 Other specified counseling: Secondary | ICD-10-CM

## 2018-01-14 DIAGNOSIS — Z7185 Encounter for immunization safety counseling: Secondary | ICD-10-CM

## 2018-01-14 NOTE — Assessment & Plan Note (Signed)
Menveo given today and will give #2 next visit.

## 2018-01-14 NOTE — Assessment & Plan Note (Signed)
Doing well on his current regimen.  No changes at this time.  rtc 6 months.

## 2018-01-14 NOTE — Progress Notes (Signed)
   Subjective:    Patient ID: Tristan ScaleKenneth R Pugh, male    DOB: 02/02/1966, 52 y.o.   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  He is doing well and no missed doses.  His creat has been a bit more elevated recently.  Last Hgb A1C was 7.0.  No associated rash, diarrhea.     Review of Systems  Constitutional: Negative for fatigue.  Gastrointestinal: Negative for diarrhea and nausea.  Skin: Negative for rash.       Objective:   Physical Exam  Constitutional: He appears well-developed and well-nourished. No distress.  HENT:  Mouth/Throat: No oropharyngeal exudate.  Eyes: No scleral icterus.  Cardiovascular: Normal rate, regular rhythm and normal heart sounds.  No murmur heard. Pulmonary/Chest: Effort normal and breath sounds normal. No respiratory distress.  Skin: No rash noted.   SH: some weight gain       Assessment & Plan:

## 2018-01-14 NOTE — Assessment & Plan Note (Signed)
Counseled on Prevnar and given today 

## 2018-01-14 NOTE — Assessment & Plan Note (Signed)
Stable creat.  Will need a dose adjustment or change if GFR stays below 30.

## 2018-01-24 ENCOUNTER — Other Ambulatory Visit: Payer: Self-pay | Admitting: Internal Medicine

## 2018-01-24 DIAGNOSIS — B20 Human immunodeficiency virus [HIV] disease: Secondary | ICD-10-CM

## 2018-02-24 ENCOUNTER — Other Ambulatory Visit: Payer: Self-pay | Admitting: Internal Medicine

## 2018-02-24 DIAGNOSIS — B20 Human immunodeficiency virus [HIV] disease: Secondary | ICD-10-CM

## 2018-03-23 ENCOUNTER — Encounter: Payer: Self-pay | Admitting: Infectious Diseases

## 2018-03-23 NOTE — Progress Notes (Signed)
Called by pt- spent last three days at [facility] with gastroenteritis  His triumeq was held for 1 day. He was told it could damage his kidneys and to contact [facility]. I let him know that this was unlikely.  I clarified with him that he was not told that he was having an allergic reaction to the medication, he denied this. I told him it was ok to restart triumeq and that it's renal risk was very low.

## 2018-03-24 ENCOUNTER — Telehealth: Payer: Self-pay | Admitting: *Deleted

## 2018-03-24 NOTE — Telephone Encounter (Signed)
Would you like to see him before January?

## 2018-03-24 NOTE — Telephone Encounter (Signed)
He is on his current ARVs specifically because he has kidney disease, so he is on the right medications for him and his kidneys.

## 2018-03-24 NOTE — Telephone Encounter (Signed)
Patient called back today following conversation with Dr Ninetta Lights over the weekend. He was hospitalized at [facility] for 3 days for gastroenteritis. He states he was told that he has kidney disease and one of his HIV medications may be contributing to it.  Per chart review, patient takes Triumeq and Prezcobix.  He would like to make sure that his HIV regimen is the best one for him and for his kidneys. He follows Dr Luciana Axe every 6 months. Please advise. Andree Coss, RN

## 2018-03-25 NOTE — Telephone Encounter (Signed)
No, it is not new and he has a more local PCP. thanks

## 2018-03-25 NOTE — Telephone Encounter (Signed)
Relayed to [place]. He is following up with PCP.

## 2018-05-20 ENCOUNTER — Encounter (HOSPITAL_COMMUNITY): Payer: Self-pay | Admitting: Emergency Medicine

## 2018-05-20 ENCOUNTER — Ambulatory Visit (HOSPITAL_COMMUNITY)
Admission: EM | Admit: 2018-05-20 | Discharge: 2018-05-20 | Disposition: A | Discharge status: Home / Self Care | Payer: 59 | Attending: Family Medicine | Admitting: Family Medicine

## 2018-05-20 DIAGNOSIS — R0789 Other chest pain: Secondary | ICD-10-CM

## 2018-05-20 MED ORDER — FAMOTIDINE 20 MG PO TABS: 20.0000 mg | ORAL_TABLET | Freq: Two times a day (BID) | ORAL | 0 refills | Status: AC

## 2018-05-20 MED ORDER — LIDOCAINE HCL 2 % IJ SOLN
INTRAMUSCULAR | Status: AC
  Filled 2018-05-20: qty 20

## 2018-05-20 MED ORDER — IPRATROPIUM BROMIDE 0.06 % NA SOLN: 2.0000 | Freq: Four times a day (QID) | NASAL | 0 refills | Status: AC

## 2018-05-20 NOTE — Discharge Instructions (Signed)
Your EKG was unchanged from prior. Start pepcid for possible acid reflux, which may be due to taking the antibiotic. Start atrovent nasal spray as directed for possible drainage down the throat. Keep hydrated, your urine should be clear to pale yellow in color. Try to avoid spicy and fatty foods for now. If experiencing worsening chest pain, shortness of breath, palpations, weakness, dizziness, please go to the emergency department for further evaluation needed.

## 2018-05-20 NOTE — ED Provider Notes (Signed)
[facility]    CSN: [id number] Arrival date & time: 05/20/18  1445     History   Chief Complaint Chief Complaint  Patient presents with  . Abdominal Pain    HPI Tristan Pugh is a 52 y.o. male.   52 year old male with history of CVA on Plavix, DM, HIV, CKD, HTN, HLD, comes in for few day history of mid chest/epigastric pain.  Patient states has been taking azithromycin and Decadron for bronchitis.  Finished medication a few days ago, and started having a pressure to the low sternum that is relieved by applying pressure with his fingers.  The symptom has been intermittent, was present prior to arrival, but has now subsided.  He denies shortness of breath, wheezing, palpitation.  Denies nausea, vomiting.  States the pressure does not radiate.  His coughing has improved, but still with clear productive cough and some drainage.  Took some Tums without relief. He states had a full cardiology workup a few years ago and was normal. No history of heart disease.      Past Medical History:  Diagnosis Date  . 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 Diabetes mellitus without complication (HCC)   . Immune deficiency disorder (HCC)   . Neuropathy   . Short-term memory loss     Patient Active Problem List   Diagnosis Date Noted  . Need for prophylactic vaccination against Streptococcus pneumoniae (pneumococcus) 01/14/2018  . Vaccine counseling 01/14/2018  . Overweight (BMI 25.0-29.9) 01/20/2014  . Screening examination for venereal disease 10/08/2013  . Encounter for long-term (current) use of medications 10/08/2013  . Chronic low back pain 05/03/2013  . CKD (chronic kidney disease) stage 3, GFR 30-59 ml/min (HCC) 12/11/2012  . Heel lesion 11/26/2012  . ERECTILE DYSFUNCTION 08/26/2008  . HERNIATED LUMBAR DISC 08/26/2008  . Human immunodeficiency virus (HIV) disease (HCC) 01/30/2007  . DIABETES MELLITUS, TYPE II 01/30/2007  . Dyslipidemia 01/30/2007  .  Essential hypertension 01/30/2007    Past Surgical History:  Procedure Laterality Date  . FOOT SURGERY    . SALIVARY GLAND SURGERY         Home Medications    Prior to Admission medications   Medication Sig Start Date End Date Taking? Authorizing Provider  amLODipine (NORVASC) 5 MG tablet Take 5 mg by mouth daily.    [provider]  atorvastatin (LIPITOR) 20 MG tablet Take by mouth. 11/18/17   [provider]  carvedilol (COREG) 25 MG tablet Take 25 mg by mouth 2 (two) times daily with a meal.    [provider]  clopidogrel (PLAVIX) 75 MG tablet TK 1 T PO QD 10/17/17   [provider]  Dulaglutide (TRULICITY) 0.75 MG/0.5ML SOPN Inject into the skin.    [provider]  famotidine (PEPCID) 20 MG tablet Take 1 tablet (20 mg total) by mouth 2 (two) times daily. 05/20/18   Cathie Hoops, Arilyn Brierley V, PA-C  fluticasone (FLONASE) 50 MCG/ACT nasal spray PLACE 2 SPRAYS INTO BOTH NOSTRILS daily prn 09/25/15   [provider]  glucose blood (ACCU-CHEK AVIVA) test strip Up to 6 times daily. 11/26/12   Tommie Sams, DO  glucose blood test strip  09/06/15   [provider]  hydrochlorothiazide (HYDRODIURIL) 25 MG tablet TK 1 T PO DAILY 03/01/15   [provider]  insulin degludec (TRESIBA) 100 UNIT/ML SOPN FlexTouch Pen Take 10 units once a day with additional as needed.  MDD: 50 units/day 09/24/17  [provider]  ipratropium (ATROVENT) 0.06 % nasal spray Place 2 sprays into both nostrils 4 (four) times daily. 05/20/18   Cathie Hoops, Melecio Cueto V, PA-C  Multiple Vitamin (ONE-A-DAY MENS PO) Take 1 tablet by mouth daily.    [provider]  PREZCOBIX 800-150 MG tablet TAKE 1 TABLET BY MOUTH DAILY. SWALLOW WHOLE. DO NOT CRUSH, BREAK OR CHEW TABLETS.TK WITH FOOD 02/24/18   Gardiner Barefoot, MD  sildenafil (REVATIO) 20 MG tablet 1-2 tab po qod 1 hours before evening meal prn sex 10/04/17   [provider]  TRIUMEQ 600-50-300 MG tablet TAKE 1  TABLET BY MOUTH DAILY 02/24/18   Comer, Belia Heman, MD    Family History Family History  Problem Relation Age of Onset  . Stroke Father   . Hypertension Mother   . Hypertension Sister     Social History Social History   Tobacco Use  . Smoking status: Never Smoker  . Smokeless tobacco: Never Used  Substance Use Topics  . Alcohol use: Yes    Alcohol/week: 0.0 standard drinks    Comment: occasional  . Drug use: No     Allergies   Dapsone; Trilipix [choline fenofibrate]; Ciprofloxacin; Prednisone; and Sulfonamide derivatives   Review of Systems Review of Systems  Reason unable to perform ROS: See HPI as above.     Physical Exam Triage Vital Signs ED Triage Vitals [05/20/18 1517]  Enc Vitals Group     BP (!) 171/101     Pulse Rate 66     Resp 18     Temp 98.2 F (36.8 C)     Temp Source Oral     SpO2 98 %     Weight      Height      Head Circumference      Peak Flow      Pain Score 7     Pain Loc      Pain Edu?      Excl. in GC?    No data found.  Updated Vital Signs BP (!) 171/101 (BP Location: Right Arm)   Pulse 66   Temp 98.2 F (36.8 C) (Oral)   Resp 18   SpO2 98%   Physical Exam  Constitutional: He is oriented to person, place, and time. He appears well-developed and well-nourished.  Non-toxic appearance. He does not appear ill. No distress.  HENT:  Head: Normocephalic and atraumatic.  Right Ear: Tympanic membrane, external ear and ear canal normal. Tympanic membrane is not erythematous and not bulging.  Left Ear: Tympanic membrane, external ear and ear canal normal. Tympanic membrane is not erythematous and not bulging.  Nose: Nose normal. Right sinus exhibits no maxillary sinus tenderness and no frontal sinus tenderness. Left sinus exhibits no maxillary sinus tenderness and no frontal sinus tenderness.  Mouth/Throat: Uvula is midline, oropharynx is clear and moist and mucous membranes are normal.  Eyes: Pupils are equal, round, and reactive to  light. Conjunctivae are normal.  Neck: Normal range of motion. Neck supple.  Cardiovascular: Normal rate, regular rhythm and normal heart sounds. Exam reveals no gallop and no friction rub.  No murmur heard. Pulmonary/Chest: Effort normal and breath sounds normal. No accessory muscle usage or stridor. No respiratory distress. He has no decreased breath sounds. He has no wheezes. He has no rhonchi. He has no rales.  Patient can point to one spot where pressures sensation was on low sternum. However, no current pain, no tenderness to palpation.   Abdominal: Soft.  Bowel sounds are normal. There is no tenderness. There is no rigidity, no rebound, no guarding and no CVA tenderness.  Lymphadenopathy:    He has no cervical adenopathy.  Neurological: He is alert and oriented to person, place, and time.  Skin: Skin is warm and dry.  Psychiatric: He has a normal mood and affect. His behavior is normal. Judgment normal.     UC Treatments / Results  Labs (all labs ordered are listed, but only abnormal results are displayed) Labs Reviewed - No data to display  EKG None  Radiology No results found.  Procedures Procedures (including critical care time)  Medications Ordered in UC Medications - No data to display  Initial Impression / Assessment and Plan / UC Course  I have reviewed the triage vital signs and the nursing notes.  Pertinent labs & imaging results that were available during my care of the patient were reviewed by me and considered in my medical decision making (see chart for details).    EKG sinus bradycardia, 57bpm, no acute ST changes, unchanged from prior. EKG also reviewed by Dr Milus Glazier. Will provide pepcid for possible acid reflux/epigastric discomfort from antibiotics. atrovent to cover for post nasal drip. Strict return precautions given. Patient expresses understanding and agrees to plan.  Case discussed with Dr Milus Glazier, who agrees to plan.  Final Clinical  Impressions(s) / UC Diagnoses   Final diagnoses:  Sternal pain    ED Prescriptions    Medication Sig Dispense Auth. Provider   ipratropium (ATROVENT) 0.06 % nasal spray Place 2 sprays into both nostrils 4 (four) times daily. 15 mL Ashanta Amoroso V, PA-C   famotidine (PEPCID) 20 MG tablet Take 1 tablet (20 mg total) by mouth 2 (two) times daily. 30 tablet Threasa Alpha, [place] 05/20/18 [phone]

## 2018-05-20 NOTE — ED Triage Notes (Signed)
Pt sts epigastric area pain x 1 week after taking meds for bronchitis; pt sts not relieved by tums

## 2018-07-02 ENCOUNTER — Other Ambulatory Visit (HOSPITAL_COMMUNITY)
Admission: RE | Admit: 2018-07-02 | Discharge: 2018-07-02 | Disposition: A | Discharge status: Home / Self Care | Payer: 59 | Source: Ambulatory Visit | Attending: Internal Medicine | Admitting: Internal Medicine

## 2018-07-02 ENCOUNTER — Other Ambulatory Visit: Payer: 59

## 2018-07-02 DIAGNOSIS — B20 Human immunodeficiency virus [HIV] disease: Secondary | ICD-10-CM

## 2018-07-02 DIAGNOSIS — Z113 Encounter for screening for infections with a predominantly sexual mode of transmission: Secondary | ICD-10-CM | POA: Diagnosis present

## 2018-07-03 ENCOUNTER — Other Ambulatory Visit: Payer: 59

## 2018-07-03 LAB — URINE CYTOLOGY ANCILLARY ONLY
Chlamydia: NEGATIVE
Neisseria Gonorrhea: NEGATIVE

## 2018-07-03 LAB — T-HELPER CELL (CD4) - (RCID CLINIC ONLY)
CD4 % Helper T Cell: 24 % — ABNORMAL LOW (ref 33–55)
CD4 T Cell Abs: 720 /uL (ref 400–2700)

## 2018-07-04 LAB — RPR: RPR: REACTIVE — AB

## 2018-07-04 LAB — HIV-1 RNA QUANT-NO REFLEX-BLD
HIV 1 RNA Quant: 30 copies/mL — ABNORMAL HIGH
HIV-1 RNA QUANT, LOG: 1.48 {Log_copies}/mL — AB

## 2018-07-04 LAB — RPR TITER: RPR Titer: 1:1 {titer} — ABNORMAL HIGH

## 2018-07-04 LAB — FLUORESCENT TREPONEMAL AB(FTA)-IGG-BLD: Fluorescent Treponemal ABS: REACTIVE — AB

## 2018-07-17 ENCOUNTER — Encounter: Payer: Self-pay | Admitting: Internal Medicine

## 2018-07-17 ENCOUNTER — Ambulatory Visit (INDEPENDENT_AMBULATORY_CARE_PROVIDER_SITE_OTHER): Payer: 59 | Admitting: Internal Medicine

## 2018-07-17 ENCOUNTER — Telehealth: Payer: Self-pay

## 2018-07-17 VITALS — Ht 70.0 in | Wt 227.0 lb

## 2018-07-17 DIAGNOSIS — B20 Human immunodeficiency virus [HIV] disease: Secondary | ICD-10-CM

## 2018-07-17 DIAGNOSIS — Z113 Encounter for screening for infections with a predominantly sexual mode of transmission: Secondary | ICD-10-CM | POA: Diagnosis not present

## 2018-07-17 DIAGNOSIS — N183 Chronic kidney disease, stage 3 unspecified: Secondary | ICD-10-CM

## 2018-07-17 MED ORDER — BICTEGRAVIR-EMTRICITAB-TENOFOV 50-200-25 MG PO TABS: 1.0000 | ORAL_TABLET | Freq: Every day | ORAL | 11 refills | Status: DC

## 2018-07-17 NOTE — Telephone Encounter (Signed)
Patient called office today with questions regarding his medication Biktarvy. Patient states that when he went to the pharmacy he was told the had a $8 copay. Patient is not sure why he has a copay when his previous medication did not require one. Patient states he called his insurance who states medication is covered under current plan. Patient would like to know if alternative medication could be prescribed due to concerns of not having enough for future copays. Tristan Pugh, [place]

## 2018-07-17 NOTE — Progress Notes (Signed)
   Subjective:    Patient ID: Tristan Pugh, male    DOB: 12-18-1965, 53 y.o.   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  He is doing well and no missed doses.  His creat has been a bit more elevated recently.   No associated rash, diarrhea.     Review of Systems  Constitutional: Negative for fatigue.  Gastrointestinal: Negative for diarrhea and nausea.  Skin: Negative for rash.       Objective:   Physical Exam Constitutional:      General: He is not in acute distress.    Appearance: He is well-developed.  HENT:     Mouth/Throat:     Pharynx: No oropharyngeal exudate.  Eyes:     General: No scleral icterus. Cardiovascular:     Rate and Rhythm: Normal rate and regular rhythm.     Heart sounds: Normal heart sounds. No murmur.  Pulmonary:     Effort: Pulmonary effort is normal. No respiratory distress.     Breath sounds: Normal breath sounds.  Skin:    Findings: No rash.    SH: some weight gain       Assessment & Plan:

## 2018-07-17 NOTE — Telephone Encounter (Signed)
Just needs a copay card probably.  thanks

## 2018-07-17 NOTE — Assessment & Plan Note (Signed)
Will be on Biktarvy with TAF-containing tenofovir which will not negatively affect his renal function

## 2018-07-17 NOTE — Assessment & Plan Note (Signed)
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  I will have him follow up in 3-4 months to be sure.

## 2018-07-17 NOTE — Assessment & Plan Note (Signed)
Screened negative.  He has not been sexually active, still with ED.

## 2018-07-17 NOTE — Telephone Encounter (Addendum)
Will reach out to Jeannette How, Pharmacy Tech to help answer patient questions as well as help set up copay assistance.  Lorenso Courier, [place]

## 2018-08-13 ENCOUNTER — Other Ambulatory Visit: Payer: Self-pay | Admitting: Internal Medicine

## 2018-08-13 DIAGNOSIS — B20 Human immunodeficiency virus [HIV] disease: Secondary | ICD-10-CM

## 2018-10-16 ENCOUNTER — Other Ambulatory Visit: Payer: 59

## 2018-10-17 ENCOUNTER — Telehealth: Payer: Self-pay | Admitting: Internal Medicine

## 2018-10-17 NOTE — Telephone Encounter (Signed)
COVID-19 Pre-Screening Questions: 10/17/18 ° °Do you currently have a fever (>100 °F), chills or unexplained body aches? (NO) ° °Are you currently experiencing new cough, shortness of breath, sore throat, runny nose? (NO) °•  °Have you recently travelled outside the state of [place] in the last 14 days?(NO) ° °Have you been in contact with someone that is currently pending confirmation of Covid19 testing or has been confirmed to have the Covid19 virus? (NO) ° °**If the patient answers NO to ALL questions -  advise the patient to please call the clinic before coming to the office should any symptoms develop.  ° ° ° °

## 2018-10-20 ENCOUNTER — Other Ambulatory Visit: Payer: Self-pay

## 2018-10-20 ENCOUNTER — Other Ambulatory Visit: Payer: Medicare HMO

## 2018-10-20 DIAGNOSIS — B20 Human immunodeficiency virus [HIV] disease: Secondary | ICD-10-CM

## 2018-10-21 LAB — T-HELPER CELL (CD4) - (RCID CLINIC ONLY)
CD4 % Helper T Cell: 26 % — ABNORMAL LOW (ref 33–55)
CD4 T Cell Abs: 520 /uL (ref 400–2700)

## 2018-10-25 LAB — HIV-1 RNA QUANT-NO REFLEX-BLD
HIV 1 RNA Quant: 21 copies/mL — ABNORMAL HIGH
HIV-1 RNA Quant, Log: 1.32 Log copies/mL — ABNORMAL HIGH

## 2018-11-10 ENCOUNTER — Encounter: Payer: Self-pay | Admitting: Internal Medicine

## 2018-11-10 ENCOUNTER — Other Ambulatory Visit: Payer: Self-pay

## 2018-11-10 ENCOUNTER — Ambulatory Visit (INDEPENDENT_AMBULATORY_CARE_PROVIDER_SITE_OTHER): Payer: Medicare HMO | Admitting: Internal Medicine

## 2018-11-10 DIAGNOSIS — B20 Human immunodeficiency virus [HIV] disease: Secondary | ICD-10-CM | POA: Diagnosis not present

## 2018-11-10 DIAGNOSIS — E663 Overweight: Secondary | ICD-10-CM | POA: Diagnosis not present

## 2018-11-10 DIAGNOSIS — Z113 Encounter for screening for infections with a predominantly sexual mode of transmission: Secondary | ICD-10-CM

## 2018-11-10 NOTE — Assessment & Plan Note (Signed)
He is exercising more with walking.

## 2018-11-10 NOTE — Assessment & Plan Note (Signed)
He is doing well with no issues.  Labs reassurring.  No changes and rtc 6 months. Lab and visit appt made for Oct

## 2018-11-10 NOTE — Progress Notes (Signed)
   Subjective:    Patient ID: Tristan Pugh, male    DOB: [birth year], 53 y.o.   MRN: [id number]  HPI Evisit (phone): 15 minutes Follow up of HIV. Recent labs with a CD4 of 520 and viral load 21.  Changed to Biktarvy last visit.  Started about 3 weeks prior to the labs.  No associated n/v/d.  No missed doses.  Gets lipid panel and other labs from his PCP.  Last creat was 2.24.  Followed by nephrology.    Review of Systems  Constitutional: Negative for fever.  Gastrointestinal: Negative for diarrhea and nausea.  Skin: Negative for rash.       Objective:   Physical Exam        Assessment & Plan:

## 2018-11-10 NOTE — Addendum Note (Signed)
Addended by: Gardiner Barefoot on: 11/10/2018 09:30 AM   Modules accepted: Orders

## 2019-04-28 ENCOUNTER — Other Ambulatory Visit: Payer: Medicaid Other

## 2019-05-12 ENCOUNTER — Ambulatory Visit: Payer: Medicaid Other | Admitting: Internal Medicine

## 2019-05-19 ENCOUNTER — Other Ambulatory Visit: Payer: Medicaid Other

## 2019-05-20 ENCOUNTER — Other Ambulatory Visit: Payer: Self-pay

## 2019-05-20 ENCOUNTER — Other Ambulatory Visit: Payer: Medicaid Other

## 2019-05-20 DIAGNOSIS — B20 Human immunodeficiency virus [HIV] disease: Secondary | ICD-10-CM

## 2019-05-20 DIAGNOSIS — Z113 Encounter for screening for infections with a predominantly sexual mode of transmission: Secondary | ICD-10-CM

## 2019-05-21 LAB — T-HELPER CELL (CD4) - (RCID CLINIC ONLY)
CD4 % Helper T Cell: 26 % — ABNORMAL LOW (ref 33–65)
CD4 T Cell Abs: 485 /uL (ref 400–1790)

## 2019-05-26 LAB — RPR TITER: RPR Titer: 1:1 {titer} — ABNORMAL HIGH

## 2019-05-26 LAB — FLUORESCENT TREPONEMAL AB(FTA)-IGG-BLD: Fluorescent Treponemal ABS: REACTIVE — AB

## 2019-05-26 LAB — HIV-1 RNA QUANT-NO REFLEX-BLD
HIV 1 RNA Quant: 40 copies/mL — ABNORMAL HIGH
HIV-1 RNA Quant, Log: 1.6 Log copies/mL — ABNORMAL HIGH

## 2019-05-26 LAB — RPR: RPR Ser Ql: REACTIVE — AB

## 2019-06-01 ENCOUNTER — Telehealth: Payer: Self-pay

## 2019-06-01 NOTE — Telephone Encounter (Signed)
COVID-19 Pre-Screening Questions:06/01/19   Do you currently have a fever (>100 F), chills or unexplained body aches? NO  Are you currently experiencing new cough, shortness of breath, sore throat, runny nose? NO .  Have you recently travelled outside the state of [place] in the last 14 days? NO .  Have you been in contact with someone that is currently pending confirmation of Covid19 testing or has been confirmed to have the Covid19 virus?  NO   **If the patient answers NO to ALL questions -  advise the patient to please call the clinic before coming to the office should any symptoms develop.     

## 2019-06-02 ENCOUNTER — Ambulatory Visit: Payer: Medicaid Other | Admitting: Internal Medicine

## 2019-06-10 ENCOUNTER — Ambulatory Visit: Payer: Medicaid Other | Admitting: Internal Medicine

## 2019-08-21 DIAGNOSIS — M7501 Adhesive capsulitis of right shoulder: Secondary | ICD-10-CM | POA: Insufficient documentation

## 2019-08-21 DIAGNOSIS — M7521 Bicipital tendinitis, right shoulder: Secondary | ICD-10-CM | POA: Insufficient documentation

## 2020-10-18 ENCOUNTER — Ambulatory Visit (INDEPENDENT_AMBULATORY_CARE_PROVIDER_SITE_OTHER): Payer: Medicare HMO

## 2020-10-18 ENCOUNTER — Other Ambulatory Visit: Payer: Self-pay

## 2020-10-18 ENCOUNTER — Ambulatory Visit (INDEPENDENT_AMBULATORY_CARE_PROVIDER_SITE_OTHER): Payer: Medicare HMO | Admitting: Sports Medicine

## 2020-10-18 ENCOUNTER — Encounter: Payer: Self-pay | Admitting: Sports Medicine

## 2020-10-18 DIAGNOSIS — M7751 Other enthesopathy of right foot: Secondary | ICD-10-CM

## 2020-10-18 DIAGNOSIS — M25571 Pain in right ankle and joints of right foot: Secondary | ICD-10-CM

## 2020-10-18 DIAGNOSIS — M79671 Pain in right foot: Secondary | ICD-10-CM

## 2020-10-18 DIAGNOSIS — M2142 Flat foot [pes planus] (acquired), left foot: Secondary | ICD-10-CM

## 2020-10-18 DIAGNOSIS — M779 Enthesopathy, unspecified: Secondary | ICD-10-CM

## 2020-10-18 DIAGNOSIS — M2141 Flat foot [pes planus] (acquired), right foot: Secondary | ICD-10-CM

## 2020-10-18 MED ORDER — TRIAMCINOLONE ACETONIDE 10 MG/ML IJ SUSP
10.0000 mg | Freq: Once | INTRAMUSCULAR | Status: AC
  Administered 2020-10-18: 10 mg

## 2020-10-18 NOTE — Progress Notes (Addendum)
Subjective: Tristan Pugh is a 55 y.o. male patient who presents to office for evaluation of right foot/ankle pain. Patient complains of progressive pain especially over the last 3 months.  Patient reports that about 8 months ago he started working out but the last 3 have been painful so he stopped denies any known injury or trauma reports that there is sharp pain at the inside of his foot and ankle that is 10 out of 10 with pressure has tried heel cushion that has helped a little bit but it is worse with first few steps in the morning or after he has been sitting and he goes to stand.  Patient also tried stretching and icing without complete relief.  Review of Systems  All other systems reviewed and are negative.   Patient Active Problem List   Diagnosis Date Noted  . Adhesive capsulitis of right shoulder 08/21/2019  . Biceps tendonitis, right 08/21/2019  . Morbid obesity, unspecified obesity type (HCC) 08/21/2019  . Hypomagnesemia 04/21/2018  . PAD (peripheral artery disease) (HCC) 12/05/2017  . Erectile dysfunction due to arterial insufficiency 11/14/2017  . Cervical radiculopathy 06/01/2016  . Degenerative disc disease, cervical 06/01/2016  . Foraminal stenosis of cervical region 06/01/2016  . Occlusion and stenosis of left carotid artery 02/21/2016  . Obesity (BMI 30.0-34.9) 01/12/2016  . Acute ischemic stroke (HCC) 02/12/2015  . Deficiency of other specified B group vitamins 02/12/2015  . 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 Personal history of transient ischemic attack (TIA), and cerebral infarction without residual deficits 02/10/2015  . Vertigo 02/10/2015  . Overweight (BMI 25.0-29.9) 01/20/2014  . Screening examination for venereal disease 10/08/2013  . Encounter for long-term (current) use of medications 10/08/2013  . Chronic low back pain 05/03/2013  . CKD (chronic kidney disease) stage 3, GFR 30-59 ml/min (HCC) 12/11/2012  . Heel lesion  11/26/2012  . ERECTILE DYSFUNCTION 08/26/2008  . HERNIATED LUMBAR DISC 08/26/2008  . Human immunodeficiency virus (HIV) disease (HCC) 01/30/2007  . DIABETES MELLITUS, TYPE II 01/30/2007  . Dyslipidemia 01/30/2007  . Essential hypertension 01/30/2007  . Hyperlipidemia 01/30/2007    Current Outpatient Medications on File Prior to Visit  Medication Sig Dispense Refill  . amLODipine (NORVASC) 5 MG tablet Take 10 mg by mouth daily.    Marland Kitchen atorvastatin (LIPITOR) 20 MG tablet Take by mouth.    . carvedilol (COREG) 25 MG tablet Take 25 mg by mouth 2 (two) times daily with a meal.    . clopidogrel (PLAVIX) 75 MG tablet TK 1 T PO QD  0  . insulin degludec (TRESIBA) 100 UNIT/ML SOPN FlexTouch Pen Take 10 units once a day with additional as needed.  MDD: 50 units/day    . bictegravir-emtricitabine-tenofovir AF (BIKTARVY) 50-200-25 MG TABS tablet Take 1 tablet by mouth daily. 30 tablet 11  . Dulaglutide (TRULICITY) 0.75 MG/0.5ML SOPN Inject into the skin.    . famotidine (PEPCID) 20 MG tablet Take 1 tablet (20 mg total) by mouth 2 (two) times daily. 30 tablet 0  . fluticasone (FLONASE) 50 MCG/ACT nasal spray PLACE 2 SPRAYS INTO BOTH NOSTRILS daily prn    . glucose blood (ACCU-CHEK AVIVA) test strip Up to 6 times daily. 100 each 6  . glucose blood test strip     . hydrochlorothiazide (HYDRODIURIL) 25 MG tablet TK 1 T PO DAILY  1  . ipratropium (ATROVENT) 0.06 % nasal spray Place 2 sprays into both nostrils 4 (four) times daily. 15 mL 0  .  Multiple Vitamin (ONE-A-DAY MENS PO) Take 1 tablet by mouth daily.    . sildenafil (REVATIO) 20 MG tablet 1-2 tab po qod 1 hours before evening meal prn sex     No current facility-administered medications on file prior to visit.    Allergies  Allergen Reactions  . Ace Inhibitors Swelling and Other (See Comments)    ` `   . Metformin Other (See Comments)    Restricted based on GFR Restricted based on GFR   . Dapsone Other (See Comments)    Pt doesn't  remember   . Trilipix [Choline Fenofibrate] Hives  . Ciprofloxacin Rash  . Prednisone      Rash - Patient denies rash with prednisone.  Says it elevates his sugars.  States he has used flonase in the past without problems.    . Sulfa Antibiotics Rash  . Sulfonamide Derivatives Rash    Objective:  General: Alert and oriented x3 in no acute distress  Dermatology: No open lesions bilateral lower extremities, no webspace macerations, no ecchymosis bilateral, all nails x 10 are well manicured.  Vascular: Dorsalis Pedis and Posterior Tibial pedal pulses palpable, Capillary Fill Time 3 seconds,(+) pedal hair growth bilateral, no edema bilateral lower extremities, Temperature gradient within normal limits.  Neurology: Michaell Cowing sensation intact via light touch bilateral.  Musculoskeletal: Mild to moderate tenderness with palpation at medial foot and ankle along the posterior tibial tendon course worse at the level of the ankle with focal soft tissue swelling likely consistent with tendinitis versus capsulitis.  Pes planus foot type.  Pain is worsened with eversion of right foot.  Xrays  Right foot   Impression: Hardware intact at first metatarsal, no acute osseous findings at ankle.  Midtarsal breech supportive of pes planus deformity.  Assessment and Plan: Problem List Items Addressed This Visit   None   Visit Diagnoses    Right foot pain    -  Primary   Relevant Orders   DG Foot Complete Right   Acute right ankle pain       Tendinitis       Relevant Medications   triamcinolone acetonide (KENALOG) 10 MG/ML injection 10 mg (Start on 10/18/2020  5:30 PM)   Capsulitis of right ankle       Relevant Medications   triamcinolone acetonide (KENALOG) 10 MG/ML injection 10 mg (Start on 10/18/2020  5:30 PM)   Pes planus of both feet           -Complete examination performed -Xrays reviewed -Discussed treatment options for tendinitis -Rx ankle gauntlet to strap from lateral to medial to take  pressure off the medial foot and ankle and to provide additional support due to severe pes planus deformity -After oral consent and aseptic prep, injected a mixture containing 1 ml of 2%  plain lidocaine, 1 ml 0.5% plain marcaine, 0.5 ml of kenalog 10 and 0.5 ml of dexamethasone phosphate into right medial foot and ankle along the posterior tibial tendon course without complication. Post-injection care discussed with patient.  Patient has had steroid injection before in shoulder and did fine without any reaction. -Recommend rest ice elevation and avoid strenuous activity or exercise that could cause a flareup in symptoms -Continue with good supportive shoes daily for foot type -Patient to return to office in 1 month or sooner if condition worsens.  Asencion Islam, DPM

## 2020-10-24 ENCOUNTER — Other Ambulatory Visit: Payer: Self-pay | Admitting: Sports Medicine

## 2020-10-24 DIAGNOSIS — M779 Enthesopathy, unspecified: Secondary | ICD-10-CM

## 2020-11-30 ENCOUNTER — Ambulatory Visit: Payer: Medicare HMO | Admitting: Sports Medicine

## 2020-12-28 ENCOUNTER — Other Ambulatory Visit: Payer: Medicaid Other

## 2020-12-28 ENCOUNTER — Other Ambulatory Visit: Payer: Self-pay

## 2020-12-28 DIAGNOSIS — Z113 Encounter for screening for infections with a predominantly sexual mode of transmission: Secondary | ICD-10-CM

## 2020-12-28 DIAGNOSIS — B20 Human immunodeficiency virus [HIV] disease: Secondary | ICD-10-CM

## 2020-12-28 DIAGNOSIS — Z79899 Other long term (current) drug therapy: Secondary | ICD-10-CM

## 2020-12-29 LAB — T-HELPER CELL (CD4) - (RCID CLINIC ONLY)
CD4 % Helper T Cell: 29 % — ABNORMAL LOW (ref 33–65)
CD4 T Cell Abs: 558 /uL (ref 400–1790)

## 2021-01-02 LAB — COMPLETE METABOLIC PANEL WITH GFR
AG Ratio: 1.3 (calc) (ref 1.0–2.5)
ALT: 20 U/L (ref 9–46)
AST: 23 U/L (ref 10–35)
Albumin: 3.9 g/dL (ref 3.6–5.1)
Alkaline phosphatase (APISO): 50 U/L (ref 35–144)
BUN/Creatinine Ratio: 6 (calc) (ref 6–22)
BUN: 14 mg/dL (ref 7–25)
CO2: 22 mmol/L (ref 20–32)
Calcium: 9.1 mg/dL (ref 8.6–10.3)
Chloride: 107 mmol/L (ref 98–110)
Creat: 2.5 mg/dL — ABNORMAL HIGH (ref 0.70–1.33)
GFR, Est African American: 32 mL/min/{1.73_m2} — ABNORMAL LOW (ref >=60)
GFR, Est Non African American: 28 mL/min/{1.73_m2} — ABNORMAL LOW (ref >=60)
Globulin: 2.9 g/dL (calc) (ref 1.9–3.7)
Glucose, Bld: 116 mg/dL — ABNORMAL HIGH (ref 65–99)
Potassium: 4.3 mmol/L (ref 3.5–5.3)
Sodium: 139 mmol/L (ref 135–146)
Total Bilirubin: 0.5 mg/dL (ref 0.2–1.2)
Total Protein: 6.8 g/dL (ref 6.1–8.1)

## 2021-01-02 LAB — LIPID PANEL
Cholesterol: 145 mg/dL (ref <200)
HDL: 43 mg/dL (ref >=40)
LDL Cholesterol (Calc): 84 mg/dL (calc)
Non-HDL Cholesterol (Calc): 102 mg/dL (calc) (ref <130)
Total CHOL/HDL Ratio: 3.4 (calc) (ref <5.0)
Triglycerides: 89 mg/dL (ref <150)

## 2021-01-02 LAB — CBC WITH DIFFERENTIAL/PLATELET
Absolute Monocytes: 332 cells/uL (ref 200–950)
Basophils Absolute: 20 cells/uL (ref 0–200)
Basophils Relative: 0.5 %
Eosinophils Absolute: 31 cells/uL (ref 15–500)
Eosinophils Relative: 0.8 %
HCT: 42.6 % (ref 38.5–50.0)
Hemoglobin: 13.8 g/dL (ref 13.2–17.1)
Lymphs Abs: 1962 cells/uL (ref 850–3900)
MCH: 28.5 pg (ref 27.0–33.0)
MCHC: 32.4 g/dL (ref 32.0–36.0)
MCV: 88 fL (ref 80.0–100.0)
MPV: 10 fL (ref 7.5–12.5)
Monocytes Relative: 8.5 %
Neutro Abs: 1556 cells/uL (ref 1500–7800)
Neutrophils Relative %: 39.9 %
Platelets: 268 10*3/uL (ref 140–400)
RBC: 4.84 10*6/uL (ref 4.20–5.80)
RDW: 13.1 % (ref 11.0–15.0)
Total Lymphocyte: 50.3 %
WBC: 3.9 10*3/uL (ref 3.8–10.8)

## 2021-01-02 LAB — HIV-1 RNA QUANT-NO REFLEX-BLD
HIV 1 RNA Quant: NOT DETECTED Copies/mL
HIV-1 RNA Quant, Log: NOT DETECTED Log cps/mL

## 2021-01-02 LAB — FLUORESCENT TREPONEMAL AB(FTA)-IGG-BLD: Fluorescent Treponemal ABS: REACTIVE — AB

## 2021-01-02 LAB — RPR TITER: RPR Titer: 1:1 {titer} — ABNORMAL HIGH

## 2021-01-02 LAB — RPR: RPR Ser Ql: REACTIVE — AB

## 2021-01-09 ENCOUNTER — Other Ambulatory Visit (HOSPITAL_COMMUNITY): Payer: Self-pay

## 2021-01-09 ENCOUNTER — Telehealth: Payer: Self-pay

## 2021-01-09 NOTE — Telephone Encounter (Signed)
[facility] Patient Product/process development scientist completed.    The patient is insured through [employer].  Biktarvy last filled on 12/15/20  We will continue to follow to see if copay assistance is needed.  Clearance Coots, CPhT Specialty Pharmacy Patient [facility] Phone: [phone] Fax:  [phone]

## 2021-01-11 ENCOUNTER — Other Ambulatory Visit: Payer: Self-pay | Admitting: Family

## 2021-01-11 DIAGNOSIS — B20 Human immunodeficiency virus [HIV] disease: Secondary | ICD-10-CM

## 2021-01-13 ENCOUNTER — Other Ambulatory Visit: Payer: Self-pay

## 2021-01-13 ENCOUNTER — Encounter: Payer: Self-pay | Admitting: Internal Medicine

## 2021-01-13 ENCOUNTER — Ambulatory Visit (INDEPENDENT_AMBULATORY_CARE_PROVIDER_SITE_OTHER): Payer: Medicare HMO | Admitting: Internal Medicine

## 2021-01-13 VITALS — BP 131/84 | HR 69 | Temp 98.0°F | Wt 223.0 lb

## 2021-01-13 DIAGNOSIS — Z113 Encounter for screening for infections with a predominantly sexual mode of transmission: Secondary | ICD-10-CM | POA: Diagnosis not present

## 2021-01-13 DIAGNOSIS — N183 Chronic kidney disease, stage 3 unspecified: Secondary | ICD-10-CM

## 2021-01-13 DIAGNOSIS — B20 Human immunodeficiency virus [HIV] disease: Secondary | ICD-10-CM | POA: Diagnosis not present

## 2021-01-13 MED ORDER — BICTEGRAVIR-EMTRICITAB-TENOFOV 50-200-25 MG PO TABS: 1.0000 | ORAL_TABLET | Freq: Every day | ORAL | 11 refills | Status: DC

## 2021-01-13 NOTE — Assessment & Plan Note (Signed)
Sporadic follow up but he has taken his medication uninterrupted without issue.  Labs confirm this.  He will continue with Biktarvy and no changes indicated.  He can continue to follow up with me yearly at this point but knows to call if he has any issues before that.  rtc 1 year.

## 2021-01-13 NOTE — Assessment & Plan Note (Addendum)
Screened negative, RPR similar to previous

## 2021-01-13 NOTE — Progress Notes (Signed)
   Subjective:    Patient ID: Tristan Pugh, male    DOB: 1966/01/15, 55 y.o.   MRN: [id number]  HPI Here for follow up of HIV I last saw him in April 2020 and he has been on [place] with no issues.  He has had no issues with getting, taking or tolerating Biktarvy.  His CD4 is 558 and viral load 558.  Creat stable at 2.5.  he continues to follow with nephrology and has a PCP, Dr. Coralee Pesa.  He has been getting cortisone injections in his left foot for pain and swelling.     Review of Systems  Constitutional:  Negative for fatigue.  Gastrointestinal:  Negative for diarrhea and nausea.  Skin:  Negative for rash.      Objective:   Physical Exam Eyes:     General: No scleral icterus. Cardiovascular:     Rate and Rhythm: Normal rate and regular rhythm.  Pulmonary:     Effort: Pulmonary effort is normal.  Skin:    Findings: No rash.  Neurological:     General: No focal deficit present.     Mental Status: He is alert.  Psychiatric:        Mood and Affect: Mood normal.   SH: no tobacco       Assessment & Plan:

## 2021-01-13 NOTE — Assessment & Plan Note (Signed)
Creat noted and stable.  No dose adjustments indicated with Biktarvy at any [facility].  Will continue to monitor.

## 2022-01-16 ENCOUNTER — Other Ambulatory Visit: Payer: Self-pay | Admitting: Internal Medicine

## 2022-06-04 ENCOUNTER — Ambulatory Visit (INDEPENDENT_AMBULATORY_CARE_PROVIDER_SITE_OTHER): Payer: Medicare Other

## 2022-06-04 ENCOUNTER — Telehealth: Payer: Self-pay | Admitting: Podiatry

## 2022-06-04 ENCOUNTER — Ambulatory Visit (INDEPENDENT_AMBULATORY_CARE_PROVIDER_SITE_OTHER): Payer: Medicare Other | Admitting: Podiatry

## 2022-06-04 DIAGNOSIS — M2141 Flat foot [pes planus] (acquired), right foot: Secondary | ICD-10-CM

## 2022-06-04 DIAGNOSIS — M2142 Flat foot [pes planus] (acquired), left foot: Secondary | ICD-10-CM | POA: Diagnosis not present

## 2022-06-04 DIAGNOSIS — M76821 Posterior tibial tendinitis, right leg: Secondary | ICD-10-CM

## 2022-06-04 DIAGNOSIS — M722 Plantar fascial fibromatosis: Secondary | ICD-10-CM

## 2022-06-04 MED ORDER — DICLOFENAC SODIUM 1 % EX GEL: 4.0000 g | Freq: Four times a day (QID) | CUTANEOUS | 4 refills | Status: AC

## 2022-06-04 NOTE — Telephone Encounter (Signed)
Spoke with patient, can't do meloxicam due to CKD and plavix use. Voltaren gel sent to pharmacy

## 2022-06-04 NOTE — Telephone Encounter (Signed)
Pt was here this afternoon at 1:15 and stated he went to the pharmacy and Pt Rx wasn't ready for Meloxicam   Please advise

## 2022-06-04 NOTE — Patient Instructions (Signed)
Posterior Tibial Tendinitis  Posterior tibial tendinitis is irritation of a tendon called the posterior tibial tendon. Your posterior tibial tendon is a cord-like tissue that connects bones of your lower leg and foot to a muscle that: Supports your arch. Helps you raise up on your toes. Helps you turn your foot down and in. This condition causes foot and ankle pain. It can also lead to a flat foot. What are the causes? This condition is most often caused by repeated stress to the tendon (overuse injury). It can also be caused by a sudden injury that stresses the tendon, such as landing on your foot after jumping or falling. What increases the risk? This condition is more likely to develop in: People who play a sport that involves putting a lot of pressure on the feet, such as: Basketball. Tennis. Soccer. Hockey. Runners. Females who are older than 57 years of age and are overweight. People with diabetes. People with decreased foot stability. People with flat feet. What are the signs or symptoms? Symptoms include: Pain in the inner ankle. Pain at the arch of your foot. Pain that gets worse with running, walking, or standing. Swelling on the inside of your ankle and foot. Weakness in your ankle or foot. Inability to stand up on tiptoe. Flattening of the arch of your foot. How is this diagnosed? This condition may be diagnosed based on: Your symptoms. Your medical history. A physical exam. Tests, such as: X-ray. MRI. Ultrasound. How is this treated? This condition may be treated by: Putting ice to the injured area. Taking NSAIDs, such as ibuprofen, to reduce pain and swelling. Wearing a special shoe or shoe insert to support your arch (orthotic). Having physical therapy. Replacing high-impact exercise with low-impact exercise, such as swimming or cycling. If your symptoms do not improve with these treatments, you may need to wear a splint, removable walking boot, or short  leg cast for 6-8 weeks to keep your foot and ankle still (immobilized). Follow these instructions at home: If you have a cast, splint, or boot: Keep it clean and dry. Check the skin around it every day. Tell your health care provider about any concerns. If you have a cast: Do not stick anything inside it to scratch your skin. Doing that increases your risk of infection. You may put lotion on dry skin around the edges of the cast. Do not put lotion on the skin underneath the cast. If you have a splint or boot: Wear it as told by your health care provider. Remove it only as told by your health care provider. Loosen it if your toes tingle, become numb, or turn cold and blue. Bathing Do not take baths, swim, or use a hot tub until your health care provider approves. Ask your health care provider if you may take showers. If your cast, splint, or boot is not waterproof: Do not let it get wet. Cover it with a waterproof covering while you take a bath or a shower. Managing pain and swelling   If directed, put ice on the injured area. If you have a removable splint or boot, remove it as told by your health care provider. Put ice in a plastic bag. Place a towel between your skin and the bag or between your cast and the bag. Leave the ice on for 20 minutes, 2-3 times a day. Move your toes often to reduce stiffness and swelling. Raise (elevate) the injured area above the level of your heart while you are sitting   or lying down. Activity Do not use the injured foot to support your body weight until your health care provider says that you can. Use crutches as told by your health care provider. Do not do activities that make pain or swelling worse. Ask your health care provider when it is safe to drive if you have a cast, splint, or boot on your foot. Return to your normal activities as told by your health care provider. Ask your health care provider what activities are safe for you. Do exercises as  told by your health care provider. General instructions Take over-the-counter and prescription medicines only as told by your health care provider. If you have an orthotic, use it as told by your health care provider. Keep all follow-up visits as told by your health care provider. This is important. How is this prevented? Wear footwear that is appropriate to your athletic activity. Avoid athletic activities that cause pain or swelling in your ankle or foot. Before being active, do range-of-motion and stretching exercises. If you develop pain or swelling while training, stop training. If you have pain or swelling that does not improve after a few days of rest, see your health care provider. If you start a new athletic activity, start gradually so you can build up your strength and flexibility. Contact a health care provider if: Your symptoms get worse. Your symptoms do not improve in 6-8 weeks. You develop new, unexplained symptoms. Your splint, boot, or cast gets damaged. Summary Posterior tibial tendinitis is irritation of a tendon called the posterior tibial tendon. This condition is most often caused by repeated stress to the tendon (overuse injury). This condition causes foot pain and ankle pain. It can also lead to a flat foot. This condition may be treated by not doing high-impact activities, applying ice, having physical therapy, wearing orthotics, and wearing a cast, splint, or boot if needed. This information is not intended to replace advice given to you by your health care provider. Make sure you discuss any questions you have with your health care provider. Document Revised: 10/28/2018 Document Reviewed: 09/04/2018 Elsevier Patient Education  2020 [employer]  Posterior Tibial Tendinitis Rehab Ask your health care provider which exercises are safe for you. Do exercises exactly as told by your health care provider and adjust them as directed. It is normal to feel mild  stretching, pulling, tightness, or discomfort as you do these exercises. Stop right away if you feel sudden pain or your pain gets worse. Do not begin these exercises until told by your health care provider. Stretching and range-of-motion exercises These exercises warm up your muscles and joints and improve the movement and flexibility in your ankle and foot. These exercises may also help to relieve pain. Standing wall calf stretch, knee straight   Stand with your hands against a wall. Extend your left / right leg behind you, and bend your front knee slightly. If directed, place a folded washcloth under the arch of your foot for support. Point the toes of your back foot slightly inward. Keeping your heels on the floor and your back knee straight, shift your weight toward the wall. Do not allow your back to arch. You should feel a gentle stretch in your upper left / right calf. Hold this position for 10 seconds. Repeat 10 times. Complete this exercise 2 times a day. Standing wall calf stretch, knee bent Stand with your hands against a wall. Extend your left / right leg behind you, and bend your front   knee slightly. If directed, place a folded washcloth under the arch of your foot for support. Point the toes of your back foot slightly inward. Unlock your back knee so it is bent. Keep your heels on the floor. You should feel a gentle stretch deep in your lower left / right calf. Hold this position for 10 seconds. Repeat 10 times. Complete this exercise 2 times a day. Strengthening exercises These exercises build strength and endurance in your ankle and foot. Endurance is the ability to use your muscles for a long time, even after they get tired. Ankle inversion with band Secure one end of a rubber exercise band or tubing to a fixed object, such as a table leg or a pole, that will stay still when the band is pulled. Loop the other end of the band around the middle of your left / right foot. Sit  on the floor facing the object with your left / right leg extended. The band or tube should be slightly tense when your foot is relaxed. Leading with your big toe, slowly bring your left / right foot and ankle inward, toward your other foot (inversion). Hold this position for 10 seconds. Slowly return your foot to the starting position. Repeat 10 times. Complete this exercise 2 times a day. Towel curls   Sit in a chair on a non-carpeted surface, and put your feet on the floor. Place a towel in front of your feet. Keeping your heel on the floor, put your left / right foot on the towel. Pull the towel toward you by grabbing the towel with your toes and curling them under. Keep your heel on the floor while you do this. Let your toes relax. Grab the towel with your toes again. Keep going until the towel is completely underneath your foot. Repeat 10 times. Complete this exercise 2 times a day. Balance exercise This exercise improves or maintains your balance. Balance is important in preventing falls. Single leg stand Without wearing shoes, stand near a railing or in a doorway. You may hold on to the railing or door frame as needed for balance. Stand on your left / right foot. Keep your big toe down on the floor and try to keep your arch lifted. If balancing in this position is too easy, try the exercise with your eyes closed or while standing on a pillow. Hold this position for 10 seconds. Repeat 10 times. Complete this exercise 2 times a day. This information is not intended to replace advice given to you by your health care provider. Make sure you discuss any questions you have with your health care provider.  

## 2022-06-05 ENCOUNTER — Encounter: Payer: Self-pay | Admitting: Podiatry

## 2022-06-05 NOTE — Progress Notes (Signed)
  Subjective:  Patient ID: Tristan Pugh, male    DOB: 05-Feb-1966,  MRN: [id number]  Chief Complaint  Patient presents with   Foot Pain    Right foot pain on the side of his foot towards the arch     56 y.o. male presents with the above complaint. History confirmed with patient.  He has had this before it is on the inside of the ankle and under the heel, is gotten significantly worse over the last month  Objective:  Physical Exam: warm, good capillary refill, no trophic changes or ulcerative lesions, normal DP and PT pulses, and normal sensory exam. Left Foot: normal exam, no swelling, tenderness, instability; ligaments intact, full range of motion of all ankle/foot joints Right Foot: point tenderness over the heel pad and pain and tenderness over the PT tendon proximal to the insertion on the navicular  No images are attached to the encounter.  Radiographs: Multiple views x-ray of the right foot: no fracture, dislocation, swelling or degenerative changes noted, plantar calcaneal spur, and posterior calcaneal spur Assessment:   1. Posterior tibial tendon dysfunction (PTTD) of right lower extremity   2. Pes planus of both feet   3. Plantar fasciitis of right foot      Plan:  Patient was evaluated and treated and all questions answered.  Discussed the etiology and treatment options for plantar fasciitis and PTTD including stretching, formal physical therapy, supportive shoegears such as a running shoe or sneaker, pre fabricated orthoses, injection therapy, and oral medications. We also discussed the role of surgical treatment of this for patients who do not improve after exhausting non-surgical treatment options.   -XR reviewed with patient -Educated patient on stretching and icing of the affected limb -Injection delivered to the plantar fascia of the left foot. -Physical therapy referral sent to [facility] -Recommended Voltaren gel for the PTTD.  He cannot take  NSAIDs due to his CKD and cannot take prednisone due to his diabetes -We also discussed possibility of custom molded orthoses to support the foot and ankle which I think he likely should benefit from -He has a plantar fascia brace which I showed him how to use today and he will continue to use this  Return in about 6 weeks (around 07/16/2022) for recheck plantar fasciitis and PT tendonitis .

## 2022-07-16 NOTE — Therapy (Incomplete)
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   Diabetes mellitus without complication ([place])    Immune deficiency disorder ([place])    Neuropathy    Short-term memory loss    Past Surgical History:  Procedure Laterality Date   FOOT SURGERY     SALIVARY GLAND SURGERY     Patient Active Problem List   Diagnosis Date Noted   Adhesive capsulitis of right shoulder 08/21/2019   Biceps tendonitis, right 08/21/2019   Morbid obesity, unspecified obesity type ([place]) 08/21/2019   Hypomagnesemia 04/21/2018   PAD (peripheral artery disease) ([place]) 12/05/2017   Erectile dysfunction due to arterial insufficiency 11/14/2017   Cervical radiculopathy 06/01/2016   Degenerative disc disease, cervical 06/01/2016   Foraminal stenosis of cervical region 06/01/2016   Occlusion and stenosis of left carotid artery 02/21/2016   Obesity (BMI 30.0-34.9) 01/12/2016   Acute ischemic stroke ([place]) 02/12/2015   Deficiency of other specified B group vitamins 02/12/2015   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[place]) 02/11/2015   Personal history of transient ischemic attack (TIA), and cerebral infarction without residual deficits 02/10/2015   Vertigo 02/10/2015   Overweight (BMI 25.0-29.9) 01/20/2014   Screening examination for venereal disease 10/08/2013   Encounter for long-term (current) use of medications 10/08/2013   Chronic low back pain 05/03/2013   CKD (chronic kidney disease) stage 3, GFR 30-59 ml/min ([place]) 12/11/2012   Heel lesion 11/26/2012   ERECTILE DYSFUNCTION 08/26/2008   HERNIATED LUMBAR DISC 08/26/2008   Human immunodeficiency virus (HIV) disease ([place]) 01/30/2007   DIABETES MELLITUS, TYPE II 01/30/2007   Dyslipidemia 01/30/2007   Essential hypertension  01/30/2007   Hyperlipidemia 01/30/2007    PCP: Burman Freestone, MD   REFERRING PROVIDER: Criselda Peaches, DPM   REFERRING DIAG: [phone] (ICD-10-CM) - Posterior tibial tendon dysfunction (PTTD) of right lower extremity  M21.41,M21.42 (ICD-10-CM) - Pes planus of both fee  M72.2 (ICD-10-CM) - Plantar fasciitis of right foot   THERAPY DIAG:  No diagnosis found.  Rationale for Evaluation and Treatment: Rehabilitation  ONSET DATE: ***  SUBJECTIVE:   SUBJECTIVE STATEMENT: ***  PERTINENT HISTORY: CKD, CVA 2012, DM, immune deficiency, short term memory loss, neuropathy PAIN:  Are you having pain? Yes: NPRS scale: ***/10 Pain location: *** Pain description: *** Aggravating factors: *** Relieving factors: ***  PRECAUTIONS: {Therapy precautions:24002}  WEIGHT BEARING RESTRICTIONS: {Yes ***/No:24003}  FALLS:  Has patient fallen in last 6 months? {fallsyesno:27318}  LIVING ENVIRONMENT: Lives with: {OPRC lives with:25569::"lives with their family"} Lives in: {Lives in:25570} Stairs: {opstairs:27293} Has following equipment at home: {Assistive devices:23999}  OCCUPATION: ***  PLOF: {PLOF:24004}  PATIENT GOALS: ***  NEXT MD VISIT:   OBJECTIVE:   DIAGNOSTIC FINDINGS:  Radiographs: Multiple views x-ray of the right foot: no fracture, dislocation, swelling or degenerative changes noted, plantar calcaneal spur, and posterior calcaneal spur  PATIENT SURVEYS:  LEFS ***  COGNITION: Overall cognitive status: {cognition:24006}     SENSATION: {sensation:27233}  EDEMA:  {edema:24020}  MUSCLE LENGTH: Hamstrings: Right *** deg; Left *** deg Thomas test: Right *** deg; Left *** deg  POSTURE: {posture:25561}  PALPATION: ***  LOWER EXTREMITY ROM:  {AROM/PROM:27142} ROM Right eval Left eval  Hip flexion    Hip extension    Hip abduction    Hip adduction  Hip internal rotation    Hip external rotation    Knee flexion    Knee extension    Ankle  dorsiflexion    Ankle plantarflexion    Ankle inversion    Ankle eversion     (Blank rows = not tested)  LOWER EXTREMITY MMT:  MMT Right eval Left eval  Hip flexion    Hip extension    Hip abduction    Hip adduction    Hip internal rotation    Hip external rotation    Knee flexion    Knee extension    Ankle dorsiflexion    Ankle plantarflexion    Ankle inversion    Ankle eversion     (Blank rows = not tested)  LOWER EXTREMITY SPECIAL TESTS:  {LEspecialtests:26242}  FUNCTIONAL TESTS:  {Functional tests:24029}  GAIT: Distance walked: *** Assistive device utilized: {Assistive devices:23999} Level of assistance: {Levels of assistance:24026} Comments: ***   TODAY'S TREATMENT:                                                                                                                              DATE: ***    PATIENT EDUCATION:  Education details: *** Person educated: {Person educated:25204} Education method: {Education Method:25205} Education comprehension: {Education Comprehension:25206}  HOME EXERCISE PROGRAM: ***  ASSESSMENT:  CLINICAL IMPRESSION: Patient is a *** y.o. *** who was seen today for physical therapy evaluation and treatment for ***.   OBJECTIVE IMPAIRMENTS: {opptimpairments:25111}.   ACTIVITY LIMITATIONS: {activitylimitations:27494}  PARTICIPATION LIMITATIONS: {participationrestrictions:25113}  PERSONAL FACTORS: {Personal factors:25162} are also affecting patient's functional outcome.   REHAB POTENTIAL: {rehabpotential:25112}  CLINICAL DECISION MAKING: {clinical decision making:25114}  EVALUATION COMPLEXITY: {Evaluation complexity:25115}   GOALS: Goals reviewed with patient? {yes/no:20286}  SHORT TERM GOALS: Target date: *** *** Baseline: Goal status: {GOALSTATUS:25110}  2.  *** Baseline:  Goal status: {GOALSTATUS:25110}  3.  *** Baseline:  Goal status: {GOALSTATUS:25110}  4.  *** Baseline:  Goal status:  {GOALSTATUS:25110}  5.  *** Baseline:  Goal status: {GOALSTATUS:25110}  6.  *** Baseline:  Goal status: {GOALSTATUS:25110}  LONG TERM GOALS: Target date: ***  *** Baseline:  Goal status: {GOALSTATUS:25110}  2.  *** Baseline:  Goal status: {GOALSTATUS:25110}  3.  *** Baseline:  Goal status: {GOALSTATUS:25110}  4.  *** Baseline:  Goal status: {GOALSTATUS:25110}  5.  *** Baseline:  Goal status: {GOALSTATUS:25110}  6.  *** Baseline:  Goal status: {GOALSTATUS:25110}   PLAN:  PT FREQUENCY: {rehab frequency:25116}  PT DURATION: {rehab duration:25117}  PLANNED INTERVENTIONS: {rehab planned interventions:25118::"Therapeutic exercises","Therapeutic activity","Neuromuscular re-education","Balance training","Gait training","Patient/Family education","Self Care","Joint mobilization"}  PLAN FOR NEXT SESSION: ***   Zaniah [place], PT 07/16/2022, 7:59 PM

## 2022-07-17 ENCOUNTER — Ambulatory Visit (INDEPENDENT_AMBULATORY_CARE_PROVIDER_SITE_OTHER): Payer: Medicare Other | Admitting: Podiatry

## 2022-07-17 ENCOUNTER — Ambulatory Visit: Payer: Medicare Other | Attending: Podiatry | Admitting: Physical Therapy

## 2022-07-17 DIAGNOSIS — Z91199 Patient's noncompliance with other medical treatment and regimen due to unspecified reason: Secondary | ICD-10-CM

## 2022-07-18 NOTE — Therapy (Incomplete)
OUTPATIENT PHYSICAL THERAPY LOWER EXTREMITY EVALUATION   Patient Name: Tristan Pugh MRN: [id number] DOB:10/19/1965, 57 y.o., male Today's Date: 07/18/2022  END OF SESSION:   Past Medical History:  Diagnosis Date   CVA (cerebral infarction) June 2012    L cerebellar   Diabetes mellitus without complication ([place])    Immune deficiency disorder ([place])    Neuropathy    Short-term memory loss    Past Surgical History:  Procedure Laterality Date   FOOT SURGERY     SALIVARY GLAND SURGERY     Patient Active Problem List   Diagnosis Date Noted   Adhesive capsulitis of right shoulder 08/21/2019   Biceps tendonitis, right 08/21/2019   Morbid obesity, unspecified obesity type ([place]) 08/21/2019   Hypomagnesemia 04/21/2018   PAD (peripheral artery disease) ([place]) 12/05/2017   Erectile dysfunction due to arterial insufficiency 11/14/2017   Cervical radiculopathy 06/01/2016   Degenerative disc disease, cervical 06/01/2016   Foraminal stenosis of cervical region 06/01/2016   Occlusion and stenosis of left carotid artery 02/21/2016   Obesity (BMI 30.0-34.9) 01/12/2016   Acute ischemic stroke ([place]) 02/12/2015   Deficiency of other specified B group vitamins 02/12/2015   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[place]) 02/11/2015   Personal history of transient ischemic attack (TIA), and cerebral infarction without residual deficits 02/10/2015   Vertigo 02/10/2015   Overweight (BMI 25.0-29.9) 01/20/2014   Screening examination for venereal disease 10/08/2013   Encounter for long-term (current) use of medications 10/08/2013   Chronic low back pain 05/03/2013   CKD (chronic kidney disease) stage 3, GFR 30-59 ml/min ([place]) 12/11/2012   Heel lesion 11/26/2012   ERECTILE DYSFUNCTION 08/26/2008   HERNIATED LUMBAR DISC 08/26/2008   Human immunodeficiency virus (HIV) disease ([place]) 01/30/2007   DIABETES MELLITUS, TYPE II 01/30/2007   Dyslipidemia 01/30/2007   Essential hypertension  01/30/2007   Hyperlipidemia 01/30/2007    PCP: Burman Freestone, MD   REFERRING PROVIDER: Criselda Peaches, DPM   REFERRING DIAG: [phone] (ICD-10-CM) - Posterior tibial tendon dysfunction (PTTD) of right lower extremity  M21.41,M21.42 (ICD-10-CM) - Pes planus of both fee  M72.2 (ICD-10-CM) - Plantar fasciitis of right foot   THERAPY DIAG:  No diagnosis found.  Rationale for Evaluation and Treatment: Rehabilitation  ONSET DATE: ***  SUBJECTIVE:   SUBJECTIVE STATEMENT: ***  PERTINENT HISTORY: CKD, CVA 2012, DM, immune deficiency, short term memory loss, neuropathy PAIN:  Are you having pain? Yes: NPRS scale: ***/10 Pain location: *** Pain description: *** Aggravating factors: *** Relieving factors: ***  PRECAUTIONS: {Therapy precautions:24002}  WEIGHT BEARING RESTRICTIONS: {Yes ***/No:24003}  FALLS:  Has patient fallen in last 6 months? {fallsyesno:27318}  LIVING ENVIRONMENT: Lives with: {OPRC lives with:25569::"lives with their family"} Lives in: {Lives in:25570} Stairs: {opstairs:27293} Has following equipment at home: {Assistive devices:23999}  OCCUPATION: ***  PLOF: {PLOF:24004}  PATIENT GOALS: ***  NEXT MD VISIT:   OBJECTIVE:   DIAGNOSTIC FINDINGS:  Radiographs: Multiple views x-ray of the right foot: no fracture, dislocation, swelling or degenerative changes noted, plantar calcaneal spur, and posterior calcaneal spur  PATIENT SURVEYS:  LEFS ***  COGNITION: Overall cognitive status: {cognition:24006}     SENSATION: {sensation:27233}  EDEMA:  {edema:24020}  MUSCLE LENGTH: Hamstrings: Right *** deg; Left *** deg Thomas test: Right *** deg; Left *** deg  POSTURE: {posture:25561}  PALPATION: ***  LOWER EXTREMITY ROM:  {AROM/PROM:27142} ROM Right eval Left eval  Hip flexion    Hip extension    Hip abduction    Hip adduction  Hip internal rotation    Hip external rotation    Knee flexion    Knee extension    Ankle  dorsiflexion    Ankle plantarflexion    Ankle inversion    Ankle eversion     (Blank rows = not tested)  LOWER EXTREMITY MMT:  MMT Right eval Left eval  Hip flexion    Hip extension    Hip abduction    Hip adduction    Hip internal rotation    Hip external rotation    Knee flexion    Knee extension    Ankle dorsiflexion    Ankle plantarflexion    Ankle inversion    Ankle eversion     (Blank rows = not tested)  LOWER EXTREMITY SPECIAL TESTS:  {LEspecialtests:26242}  FUNCTIONAL TESTS:  {Functional tests:24029}  GAIT: Distance walked: *** Assistive device utilized: {Assistive devices:23999} Level of assistance: {Levels of assistance:24026} Comments: ***   TODAY'S TREATMENT:                                                                                                                              DATE: ***  07/19/22  PATIENT EDUCATION:  Education details: *** Person educated: {Person educated:25204} Education method: {Education Method:25205} Education comprehension: {Education Comprehension:25206}  HOME EXERCISE PROGRAM: ***  ASSESSMENT:  CLINICAL IMPRESSION: Patient is a *** y.o. *** who was seen today for physical therapy evaluation and treatment for ***.   OBJECTIVE IMPAIRMENTS: {opptimpairments:25111}.   ACTIVITY LIMITATIONS: {activitylimitations:27494}  PARTICIPATION LIMITATIONS: {participationrestrictions:25113}  PERSONAL FACTORS: {Personal factors:25162} are also affecting patient's functional outcome.   REHAB POTENTIAL: {rehabpotential:25112}  CLINICAL DECISION MAKING: {clinical decision making:25114}  EVALUATION COMPLEXITY: {Evaluation complexity:25115}   GOALS: Goals reviewed with patient? {yes/no:20286}  SHORT TERM GOALS: Target date: *** *** Baseline: Goal status: {GOALSTATUS:25110}  2.  *** Baseline:  Goal status: {GOALSTATUS:25110}  3.  *** Baseline:  Goal status: {GOALSTATUS:25110}  4.  *** Baseline:  Goal status:  {GOALSTATUS:25110}  5.  *** Baseline:  Goal status: {GOALSTATUS:25110}  6.  *** Baseline:  Goal status: {GOALSTATUS:25110}  LONG TERM GOALS: Target date: ***  *** Baseline:  Goal status: {GOALSTATUS:25110}  2.  *** Baseline:  Goal status: {GOALSTATUS:25110}  3.  *** Baseline:  Goal status: {GOALSTATUS:25110}  4.  *** Baseline:  Goal status: {GOALSTATUS:25110}  5.  *** Baseline:  Goal status: {GOALSTATUS:25110}  6.  *** Baseline:  Goal status: {GOALSTATUS:25110}   PLAN:  PT FREQUENCY: {rehab frequency:25116}  PT DURATION: {rehab duration:25117}  PLANNED INTERVENTIONS: {rehab planned interventions:25118::"Therapeutic exercises","Therapeutic activity","Neuromuscular re-education","Balance training","Gait training","Patient/Family education","Self Care","Joint mobilization"}  PLAN FOR NEXT SESSION: ***   Ammi Hutt, PT 07/18/2022, 9:59 PM

## 2022-07-18 NOTE — Progress Notes (Signed)
Patient was no-show for appointment today 

## 2022-07-19 ENCOUNTER — Ambulatory Visit: Payer: Medicare Other | Admitting: Physical Therapy

## 2022-07-24 ENCOUNTER — Encounter: Payer: Medicare Other | Admitting: Physical Therapy

## 2022-07-31 ENCOUNTER — Encounter: Payer: Medicare Other | Admitting: Physical Therapy

## 2022-08-07 ENCOUNTER — Encounter: Payer: Medicare Other | Admitting: Physical Therapy

## 2023-01-21 ENCOUNTER — Other Ambulatory Visit: Payer: Self-pay | Admitting: Internal Medicine

## 2023-01-28 ENCOUNTER — Other Ambulatory Visit: Payer: Self-pay

## 2023-01-28 DIAGNOSIS — Z113 Encounter for screening for infections with a predominantly sexual mode of transmission: Secondary | ICD-10-CM

## 2023-01-28 DIAGNOSIS — B20 Human immunodeficiency virus [HIV] disease: Secondary | ICD-10-CM

## 2023-01-28 DIAGNOSIS — Z79899 Other long term (current) drug therapy: Secondary | ICD-10-CM

## 2023-01-29 ENCOUNTER — Other Ambulatory Visit: Payer: Medicare HMO

## 2023-01-29 ENCOUNTER — Other Ambulatory Visit: Payer: Self-pay

## 2023-01-29 ENCOUNTER — Other Ambulatory Visit (HOSPITAL_COMMUNITY)
Admission: RE | Admit: 2023-01-29 | Discharge: 2023-01-29 | Disposition: A | Discharge status: Home / Self Care | Payer: Medicare HMO | Source: Ambulatory Visit | Attending: Internal Medicine | Admitting: Internal Medicine

## 2023-01-29 DIAGNOSIS — Z79899 Other long term (current) drug therapy: Secondary | ICD-10-CM | POA: Insufficient documentation

## 2023-01-29 DIAGNOSIS — Z113 Encounter for screening for infections with a predominantly sexual mode of transmission: Secondary | ICD-10-CM

## 2023-01-29 DIAGNOSIS — B20 Human immunodeficiency virus [HIV] disease: Secondary | ICD-10-CM | POA: Diagnosis present

## 2023-01-30 LAB — URINE CYTOLOGY ANCILLARY ONLY
Chlamydia: NEGATIVE
Comment: NEGATIVE
Comment: NORMAL
Neisseria Gonorrhea: NEGATIVE

## 2023-01-30 LAB — T-HELPER CELL (CD4) - (RCID CLINIC ONLY)
CD4 % Helper T Cell: 33 % (ref 33–65)
CD4 T Cell Abs: 538 /uL (ref 400–1790)

## 2023-01-31 ENCOUNTER — Other Ambulatory Visit: Payer: Self-pay | Admitting: Internal Medicine

## 2023-01-31 LAB — CBC WITH DIFFERENTIAL/PLATELET
Absolute Monocytes: 293 cells/uL (ref 200–950)
Basophils Absolute: 19 cells/uL (ref 0–200)
Basophils Relative: 0.5 %
Eosinophils Absolute: 49 cells/uL (ref 15–500)
Eosinophils Relative: 1.3 %
HCT: 36.1 % — ABNORMAL LOW (ref 38.5–50.0)
Hemoglobin: 11.9 g/dL — ABNORMAL LOW (ref 13.2–17.1)
Lymphs Abs: 1881 cells/uL (ref 850–3900)
MCH: 29.6 pg (ref 27.0–33.0)
MCHC: 33 g/dL (ref 32.0–36.0)
MCV: 89.8 fL (ref 80.0–100.0)
MPV: 10.1 fL (ref 7.5–12.5)
Monocytes Relative: 7.7 %
Neutro Abs: 1558 cells/uL (ref 1500–7800)
Neutrophils Relative %: 41 %
Platelets: 219 10*3/uL (ref 140–400)
RBC: 4.02 10*6/uL — ABNORMAL LOW (ref 4.20–5.80)
RDW: 13 % (ref 11.0–15.0)
Total Lymphocyte: 49.5 %
WBC: 3.8 10*3/uL (ref 3.8–10.8)

## 2023-01-31 LAB — COMPLETE METABOLIC PANEL WITH GFR
AG Ratio: 1.4 (calc) (ref 1.0–2.5)
ALT: 16 U/L (ref 9–46)
AST: 16 U/L (ref 10–35)
Albumin: 4.3 g/dL (ref 3.6–5.1)
Alkaline phosphatase (APISO): 49 U/L (ref 35–144)
BUN/Creatinine Ratio: 9 (calc) (ref 6–22)
BUN: 52 mg/dL — ABNORMAL HIGH (ref 7–25)
CO2: 21 mmol/L (ref 20–32)
Calcium: 9.2 mg/dL (ref 8.6–10.3)
Chloride: 109 mmol/L (ref 98–110)
Creat: 5.7 mg/dL — ABNORMAL HIGH (ref 0.70–1.30)
Globulin: 3 g/dL (calc) (ref 1.9–3.7)
Glucose, Bld: 106 mg/dL — ABNORMAL HIGH (ref 65–99)
Potassium: 4.7 mmol/L (ref 3.5–5.3)
Sodium: 139 mmol/L (ref 135–146)
Total Bilirubin: 0.6 mg/dL (ref 0.2–1.2)
Total Protein: 7.3 g/dL (ref 6.1–8.1)
eGFR: 11 mL/min/{1.73_m2} — ABNORMAL LOW (ref >=60)

## 2023-01-31 LAB — LIPID PANEL
Cholesterol: 161 mg/dL (ref <200)
HDL: 51 mg/dL (ref >=40)
LDL Cholesterol (Calc): 93 mg/dL (calc)
Non-HDL Cholesterol (Calc): 110 mg/dL (calc) (ref <130)
Total CHOL/HDL Ratio: 3.2 (calc) (ref <5.0)
Triglycerides: 81 mg/dL (ref <150)

## 2023-01-31 LAB — HIV-1 RNA QUANT-NO REFLEX-BLD
HIV 1 RNA Quant: NOT DETECTED Copies/mL
HIV-1 RNA Quant, Log: NOT DETECTED Log cps/mL

## 2023-01-31 LAB — RPR: RPR Ser Ql: NONREACTIVE

## 2023-02-01 ENCOUNTER — Other Ambulatory Visit: Payer: Self-pay | Admitting: Internal Medicine

## 2023-02-21 ENCOUNTER — Ambulatory Visit: Payer: Medicare HMO | Admitting: Internal Medicine

## 2023-02-21 ENCOUNTER — Other Ambulatory Visit: Payer: Self-pay

## 2023-02-21 ENCOUNTER — Encounter: Payer: Self-pay | Admitting: Internal Medicine

## 2023-02-21 VITALS — BP 145/71 | HR 55 | Temp 97.7°F | Ht 71.0 in | Wt 193.0 lb

## 2023-02-21 DIAGNOSIS — Z113 Encounter for screening for infections with a predominantly sexual mode of transmission: Secondary | ICD-10-CM

## 2023-02-21 DIAGNOSIS — B20 Human immunodeficiency virus [HIV] disease: Secondary | ICD-10-CM

## 2023-02-21 MED ORDER — BIKTARVY 50-200-25 MG PO TABS: 1.0000 | ORAL_TABLET | Freq: Every day | ORAL | 11 refills | Status: DC

## 2023-02-21 NOTE — Assessment & Plan Note (Signed)
Screened negative No recent sexual activity.

## 2023-02-21 NOTE — Progress Notes (Signed)
   Subjective:    Patient ID: Tristan Pugh, male    DOB: [birth year], 57 y.o.   MRN: [id number]  HPI Tristan Pugh is here for follow up of HIV He continues on [place] with no missed doses.  No issues with getting or taking his Biktarvy.  Under care with nephrology with increased creat over 5.  No new issus otherwise.     Review of Systems  Constitutional:  Negative for fatigue.  Gastrointestinal:  Negative for diarrhea.  Skin:  Negative for rash.       Objective:   Physical Exam Eyes:     General: No scleral icterus. Pulmonary:     Effort: Pulmonary effort is normal.  Neurological:     Mental Status: He is alert.   SH: no tobacco        Assessment & Plan:

## 2023-02-21 NOTE — Assessment & Plan Note (Signed)
He continues doing well and can continue on Biktarvy with no changes with ESRD.  Labs reviewed with him.   Refill provided for 1 year Follow up in 1 year  I have personally spent 35 minutes involved in face-to-face and non-face-to-face activities for this patient on the day of the visit. Professional time spent includes the following activities: Preparing to see the patient (review of tests), Obtaining and/or reviewing separately obtained history (admission/discharge record), Performing a medically appropriate examination and/or evaluation , Ordering medications/tests/procedures, referring and communicating with other health care professionals, Documenting clinical information in the EMR, Independently interpreting results (not separately reported), Communicating results to the patient/family/caregiver, Counseling and educating the patient/family/caregiver and Care coordination (not separately reported).

## 2023-12-13 NOTE — Progress Notes (Signed)
 The ASCVD Risk score (Arnett DK, et al., 2019) failed to calculate for the following reasons:   Risk score cannot be calculated because patient has a medical history suggesting prior/existing ASCVD  Arlon Bergamo, BSN, RN

## 2024-01-19 NOTE — Discharge Summary (Signed)
 ------------------------------------------------------------------------------- Attestation signed by Beauford Lennox, MD at 01/20/2024  4:11 PM I saw and examined the pt at bedside, reviewed pertinent clinical data and discussed plan of care with resident physician. I have reviewed this note and agree with assessment and plan as documented here. Stable for discharge today.  -------------------------------------------------------------------------------  HP Gen Med II Discharge Summary   Name: Tristan Pugh MRN: [id number] Age: 11 yrs DOB: 07-18-1965  Admit date: 01/18/2024 Discharge date: 01/19/24   Admitting Physician: Beauford Lennox, MD Discharge Physician: Beauford Lennox, MD  Admission Diagnoses:  ESRD (end stage renal disease) on dialysis    (CMD) [N18.6, Z99.2]   Discharge Diagnoses:  ESRD Hypertensive urgency  Admission Condition: fair Discharged Condition: good  Hospital Course:  For full details, please see H&P, progress notes, consult notes and ancillary notes. Briefly, patient is a 58 year old male with ESRD on MWF dialysis, type 2 diabetes, hypertension, lumbar radiculopathy, PAD, history of stroke, complete heart block s/p pacemaker, HIV who presented with shortness of breath and was found to have pulmonary edema and hypertension to 210s systolic.  Last dialysis was 7/4, denies any missed sessions.  Reportedly patient had been losing weight and they had not been pulling off fluid at dialysis since he was at his target weight.  Metoprolol was changed to Coreg  for better BP control.  Nephrology consulted and patient underwent dialysis 7/5 with removal of 3 L with improvement in shortness of breath and blood pressure.  Suspect new target dry weight should be 72-73 kg.   On day of discharge, patient is clinically stable with no new examination findings or acute symptoms compared to prior.  The patient was seen by the attending physician on the date of discharge and deemed  stable and acceptable for discharge.  The patient's chronic medical conditions were treated accordingly per the patient's home medication regimen.  The patient's medication reconciliation, follow-up appointments, discharge orders, instructions, and significant lab and diagnostic studies are as noted.   Discharge Follow-up Action Items: Follow up with PCP in 1-2 weeks for BP management Follow-up with nephrology for BP and fluid management.   Continue dialysis as scheduled MWF. Suspect dry weight should be 72-73 kg but will defer to primary nephrologist.  Medication Changes: Stop metoprolol  Start Coreg  25 mg twice daily   Patient's Ordered Code Status: Full Code     Consults: IP CONSULT TO HOSPITALIST IP CONSULT TO NEPHROLOGY  Significant Diagnostic Studies:  CBC brief  Recent Labs    01/19/24 0029  WBC 5.67  RBC 3.00*  HGB 9.1*  HCT 26.7*  PLT 285   BMP  Recent Labs    01/19/24 0029  NA 135*  K 3.8  CL 101  CO2 28  BUN 17  CREATININE 3.31*  CALCIUM  8.1*    XR Chest 2 Views  Final Result by Delmar Herbert Winfred Booker Results In [place] [id number] (07/05 0153)  CLINICAL DATA:  Shortness of breath    EXAM:  CHEST - 2 VIEW    COMPARISON:  01/14/2024    FINDINGS:  Right central venous catheter with tip projecting over the  cavoatrial junction region. Mild cardiac enlargement with mild  pulmonary vascular congestion. Mild perihilar infiltrates likely  edema. Mild progression since prior study. Small bilateral pleural  effusions. No pneumothorax. Mediastinal contours appear intact.    IMPRESSION:  Cardiac enlargement with mild pulmonary vascular congestion and mild  perihilar infiltration, likely edema. Small bilateral pleural  effusions. There is progression since prior study.  Electronically Signed    By: Elsie Gravely M.D.    On: 01/18/2024 01:53         Disposition: Home  Patient Instructions:    Medication List     START taking these medications     carvediloL  25 mg tablet Commonly known as: COREG  Take 1 tablet (25 mg total) by mouth in the morning and 1 tablet (25 mg total) in the evening. Take with meals.   isosorbide mononitrate 60 mg 24 hr tablet Commonly known as: IMDUR Take 1 tablet (60 mg total) by mouth daily.       CONTINUE taking these medications    Accu-Chek Aviva Plus test strp test strip Generic drug: glucose blood CHECK BLOOD SUGAR 2 TIMES A DAY   Accu-Chek Softclix Lancets Misc Generic drug: Lancets   acetaminophen  500 mg tablet Commonly known as: TYLENOL  Take 2 tablets (1,000 mg total) by mouth every 8 (eight) hours as needed (pain).   atorvastatin  20 mg tablet Commonly known as: LIPITOR TAKE 1 TABLET(20 MG) BY MOUTH DAILY   Biktarvy  50-200-25 mg Tab per tablet Generic drug: bictegrav-emtricit-tenofov ala Take 1 tablet by mouth daily.   clopidogreL  75 mg tablet Commonly known as: PLAVIX  TAKE 1 TABLET(75 MG) BY MOUTH DAILY   dilTIAZem 180 mg Cs24 24 hr capsule Commonly known as: TIAZAC Take 1 capsule (180 mg total) by mouth daily.   doxazosin 4 mg tablet Commonly known as: CARDURA Take 1 tablet (4 mg total) by mouth nightly.   ergocalciferol  50 mcg (2,000 unit) Tab tablet Commonly known as: VITAMIN D2 Take 2,000 Units by mouth Once Daily.   Mounjaro 10 mg/0.5 mL subcutaneous pen injector Generic drug: tirzepatide Inject 0.5 mL (10 mg total) under the skin every 7 days.   multivit with min-folic acid 120 mcg Chew chewable tablet Commonly known as: CENTRUM ADULT 50+ Chew 1 tablet daily.   oxyCODONE-acetaminophen  5-325 mg per tablet Commonly known as: PERCOCET Take 1 tablet by mouth 2 (two) times a day as needed for moderate pain (4-6) or severe pain (7-10).   tiZANidine 4 mg tablet Commonly known as: ZANAFLEX Take 4 mg by mouth every 6 (six) hours as needed for muscle spasms.       STOP taking these medications    metoprolol succinate 25 mg 24 hr tablet Commonly known as:  TOPROL XL         Where to Get Your Medications     These medications were sent to [id number] - [place], [place] - [address] - PHONE: [phone] - FAX: [phone]  [address], [place] [address]    Phone: [phone]  carvediloL  25 mg tablet      Discharge Orders     Full Code     Physical Therapy Home Health Coordination     Details:    Actions: Resume Home Health       Follow-up  Future Appointments  Date Time Provider Department Center  01/23/2024  1:20 PM Eleanor GORMAN Gay, PA-C [facility]  [facility]  01/23/2024  1:50 PM Autumn Reggy Molt, PA-C [facility] END CN None  02/03/2024  2:40 PM Darnelle FORBES Lefevre, MD [facility]  [facility]  02/19/2024  3:00 PM Shelly Charity, MD [facility] PN PRE [facility]  04/14/2024 11:00 AM [facility] REMOTE [facility] [address]  04/22/2024  1:30 PM Morene Quan Renick, PA-C [facility] [address]  04/23/2024 10:30 AM [facility] TRANSPLANT MASTER SCHEDULE [facility] TRA ABD [facility] [place]  07/14/2024 10:30 AM [facility] REMOTE [facility] [address]    Electronically signed by: Ronita Romance, MD, Resident, 01/19/2024 6:06 PM

## 2024-02-19 ENCOUNTER — Ambulatory Visit: Admitting: Internal Medicine

## 2024-02-19 ENCOUNTER — Other Ambulatory Visit: Payer: Self-pay

## 2024-02-19 ENCOUNTER — Encounter: Payer: Self-pay | Admitting: Internal Medicine

## 2024-02-19 VITALS — BP 122/67 | HR 54 | Temp 98.0°F | Wt 166.0 lb

## 2024-02-19 DIAGNOSIS — B20 Human immunodeficiency virus [HIV] disease: Secondary | ICD-10-CM

## 2024-02-19 DIAGNOSIS — M545 Low back pain, unspecified: Secondary | ICD-10-CM

## 2024-02-19 DIAGNOSIS — Z111 Encounter for screening for respiratory tuberculosis: Secondary | ICD-10-CM | POA: Diagnosis not present

## 2024-02-19 DIAGNOSIS — Z1159 Encounter for screening for other viral diseases: Secondary | ICD-10-CM

## 2024-02-19 MED ORDER — BICTEGRAVIR-EMTRICITAB-TENOFOV 50-200-25 MG PO TABS: 1.0000 | ORAL_TABLET | Freq: Every day | ORAL | 11 refills | Status: AC

## 2024-02-19 NOTE — Addendum Note (Signed)
 Addended by: Bettie Capistran M on: 02/19/2024 02:28 PM   Modules accepted: Orders

## 2024-02-19 NOTE — Progress Notes (Signed)
 Subjective:    Patient ID: Tristan Pugh, male    DOB: Mar 07, 1966, 58 y.o.   MRN: [id number]  HPI Abdalrahman is here for follow up of HIV He continues on Biktarvy  with no missed doses.  No issues with getting or taking his Biktarvy .  Under care with nephrology with increased creat over 5.  No new issus otherwise.     02/19/24 id clinic visit First visit with me No missed dose biktarvy  last 4 weeks No complaint At normal good health baseline Lower back though had strained 3 months. No numbness tingling in the legs. Prior to this was jogging regularly every weeks. Doing physical therapy (finished) home health 6 weeks. Started being more active and the back is hurting more No urinary incontinence No fever, chill No weight loss intentionally; no night sweat No hx back infection Dialysis patient via chest catheter. He has a left arm avf  but not yet matured  Reviewed other medical problems Dm2 Htn Hlp Gerd PAD Hx cva Esrd  No hx blood stream infection  Mri done 12/13/23 no acute infection  He is seeing back specialist today   Review of Systems  Constitutional:  Negative for fatigue.  Gastrointestinal:  Negative for diarrhea.  Skin:  Negative for rash.       Objective:   Physical Exam Eyes:     General: No scleral icterus. Pulmonary:     Effort: Pulmonary effort is normal.  Neurological:     Mental Status: He is alert.     Labs: Lab Results  Component Value Date   WBC 3.8 01/29/2023   HGB 11.9 (L) 01/29/2023   HCT 36.1 (L) 01/29/2023   MCV 89.8 01/29/2023   PLT 219 01/29/2023   Last metabolic panel Lab Results  Component Value Date   GLUCOSE 106 (H) 01/29/2023   NA 139 01/29/2023   K 4.7 01/29/2023   CL 109 01/29/2023   CO2 21 01/29/2023   BUN 52 (H) 01/29/2023   CREATININE 5.70 (H) 01/29/2023   EGFR 11 (L) 01/29/2023   CALCIUM  9.2 01/29/2023   PROT 7.3 01/29/2023   ALBUMIN 4.6 05/31/2016   BILITOT 0.6 01/29/2023   ALKPHOS 53 05/31/2016   AST  16 01/29/2023   ALT 16 01/29/2023   HIV:   Imaging: Reviewed   12/13/23 mri lumbar spine 1. No MRI evidence for acute infection within the lumbar spine.  2. Multilevel lumbar spondylosis without significant spinal  stenosis. Moderate bilateral L3 and L4 foraminal stenosis related to  disc bulge, reactive endplate spurring, and facet hypertrophy.  3. 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 Careful correlation with numbering system on this  exam recommended prior to any potential future intervention.       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

## 2024-02-19 NOTE — Patient Instructions (Addendum)
 Will give you tetanus booster and meningitis booster vaccine next visit or you can have your pcp do that   Labs today   See me in a year   If back pain worse let me and your primary care know. See what the back specialist says. Mri recently no sign of infection

## 2024-02-20 ENCOUNTER — Ambulatory Visit: Admitting: Internal Medicine

## 2024-02-20 ENCOUNTER — Ambulatory Visit: Payer: Medicare HMO | Admitting: Internal Medicine

## 2024-03-24 ENCOUNTER — Other Ambulatory Visit: Payer: Self-pay | Admitting: Internal Medicine

## 2025-03-09 ENCOUNTER — Ambulatory Visit: Admitting: Internal Medicine
